# Patient Record
Sex: Male | Born: 2000
Health system: Southern US, Community
[De-identification: ages and names within clinical notes are randomized; demographics above are authoritative.]

## PROBLEM LIST (undated history)

## (undated) DIAGNOSIS — Z789 Other specified health status: Secondary | ICD-10-CM

---
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
## 2020-07-31 ENCOUNTER — Emergency Department (HOSPITAL_BASED_OUTPATIENT_CLINIC_OR_DEPARTMENT_OTHER)
Admission: EM | Admit: 2020-07-31 | Discharge: 2020-07-31 | Disposition: A | Discharge status: Home / Self Care | Payer: BC Managed Care – PPO | Attending: Emergency Medicine | Admitting: Emergency Medicine

## 2020-07-31 ENCOUNTER — Other Ambulatory Visit: Payer: Self-pay

## 2020-07-31 ENCOUNTER — Emergency Department (HOSPITAL_BASED_OUTPATIENT_CLINIC_OR_DEPARTMENT_OTHER): Payer: BC Managed Care – PPO

## 2020-07-31 ENCOUNTER — Encounter (HOSPITAL_BASED_OUTPATIENT_CLINIC_OR_DEPARTMENT_OTHER): Payer: Self-pay | Admitting: Emergency Medicine

## 2020-07-31 DIAGNOSIS — Z7722 Contact with and (suspected) exposure to environmental tobacco smoke (acute) (chronic): Secondary | ICD-10-CM | POA: Insufficient documentation

## 2020-07-31 DIAGNOSIS — S99912A Unspecified injury of left ankle, initial encounter: Secondary | ICD-10-CM | POA: Diagnosis present

## 2020-07-31 DIAGNOSIS — Y9367 Activity, basketball: Secondary | ICD-10-CM | POA: Diagnosis not present

## 2020-07-31 DIAGNOSIS — S82842A Displaced bimalleolar fracture of left lower leg, initial encounter for closed fracture: Secondary | ICD-10-CM

## 2020-07-31 DIAGNOSIS — W2105XA Struck by basketball, initial encounter: Secondary | ICD-10-CM | POA: Diagnosis not present

## 2020-07-31 MED ORDER — OXYCODONE-ACETAMINOPHEN 5-325 MG PO TABS: 1.0000 | ORAL_TABLET | ORAL | 0 refills | Status: DC | PRN

## 2020-07-31 MED ORDER — OXYCODONE-ACETAMINOPHEN 5-325 MG PO TABS
2.0000 | ORAL_TABLET | Freq: Once | ORAL | Status: AC
  Administered 2020-07-31: 2 via ORAL
  Filled 2020-07-31: qty 2

## 2020-07-31 MED ORDER — IBUPROFEN 800 MG PO TABS: 800.0000 mg | ORAL_TABLET | Freq: Three times a day (TID) | ORAL | 0 refills | Status: DC

## 2020-07-31 NOTE — ED Notes (Addendum)
Splint applied to left leg/ankle by Trinna Post, nursing aid. Pt given/educated on crutches.

## 2020-07-31 NOTE — Discharge Instructions (Signed)
You have an ankle fracture.  Please keep the splint in place and use crutches  Take Tylenol and Motrin for pain  Take Percocet for severe pain.  Apply ice for swelling  Please call your orthopedic doctor tomorrow to get an appointment this week  Return to ER if you have worse ankle pain and foot swelling and toes turning

## 2020-07-31 NOTE — ED Provider Notes (Signed)
[facility] EMERGENCY DEPARTMENT Provider Note   CSN: [id number] Arrival date & time: 07/31/20  1946     History Chief Complaint  Patient presents with  . Ankle Pain    Bruce Hickman is a 20 y.o. male here presenting with left ankle pain and swelling.  Patient states that he was playing basketball this afternoon and jumped up and landed on his left foot and heard a pop.  He states that he was able to bear some weight on it but has progressive pain and swelling.  He states that he use his father's crutches and came here for further evaluation.  Denies any previous injury to that ankle.  Denies any previous medical problems  The history is provided by the patient.       History reviewed. No pertinent past medical history.  Patient Active Problem List   Diagnosis Date Noted  . Poor sleep hygiene 05/23/2017  . Well child check 12/01/2013    History reviewed. No pertinent surgical history.     Family History  Problem Relation Age of Onset  . High blood pressure Father   . Other Father        R hand surgery  . Sleep apnea Father   . Diabetes Paternal Grandmother   . High blood pressure Paternal Grandmother   . Other Paternal Grandfather        prediabetes    Social History   Tobacco Use  . Smoking status: Passive Smoke Exposure - Never Smoker  . Smokeless tobacco: Never Used  Substance Use Topics  . Alcohol use: Never  . Drug use: Yes    Types: Marijuana    Home Medications Prior to Admission medications   Medication Sig Start Date End Date Taking? Authorizing Provider  Multiple Vitamins-Minerals (MULTIVITAMIN ADULTS PO) Take 1 tablet by mouth daily.    [provider]    Allergies    Patient has no known allergies.  Review of Systems   Review of Systems  Musculoskeletal:       L ankle pain and swelling   All other systems reviewed and are negative.   Physical Exam Updated Vital Signs BP (!) 147/51 (BP Location: Right Arm)    Pulse 92   Temp 99.2 F (37.3 C) (Oral)   Resp 17   Ht 6\' 6"  (1.981 m)   Wt 125.2 kg   SpO2 100%   BMI 31.90 kg/m   Physical Exam Vitals and nursing note reviewed.  Constitutional:      Appearance: Normal appearance.     Comments: Uncomfortable,  HENT:     Head: Normocephalic and atraumatic.     Nose: Nose normal.     Mouth/Throat:     Mouth: Mucous membranes are moist.  Eyes:     Extraocular Movements: Extraocular movements intact.     Pupils: Pupils are equal, round, and reactive to light.  Cardiovascular:     Rate and Rhythm: Normal rate and regular rhythm.     Pulses: Normal pulses.     Heart sounds: Normal heart sounds.  Pulmonary:     Effort: Pulmonary effort is normal.     Breath sounds: Normal breath sounds.  Abdominal:     General: Abdomen is flat.     Palpations: Abdomen is soft.  Musculoskeletal:     Cervical back: Normal range of motion and neck supple.     Comments: Left ankle swollen.  Patient has good pedal pulses.  Patient is able to wiggle  toes and has no foot tenderness.  Skin:    General: Skin is warm.     Capillary Refill: Capillary refill takes less than 2 seconds.  Neurological:     General: No focal deficit present.     Mental Status: He is alert and oriented to person, place, and time.  Psychiatric:        Mood and Affect: Mood normal.        Behavior: Behavior normal.     ED Results / Procedures / Treatments   Labs (all labs ordered are listed, but only abnormal results are displayed) Labs Reviewed - No data to display  EKG None  Radiology DG Ankle Complete Left  Result Date: 07/31/2020 CLINICAL DATA:  Twisting injury EXAM: LEFT ANKLE COMPLETE - 3+ VIEW COMPARISON:  None. FINDINGS: Acute nondisplaced fracture involving the medial malleolus. Acute avulsion fracture at the tip of the fibular malleolus. Marked lateral soft tissue swelling. Posterior malleolus without definitive fracture lucency. Ankle mortise is symmetric. IMPRESSION:  Acute bimalleolar fracture with considerable soft tissue swelling Electronically Signed   By: Jasmine Pang M.D.   On: 07/31/2020 20:40    Procedures Procedures   Medications Ordered in ED Medications  oxyCODONE-acetaminophen (PERCOCET/ROXICET) 5-325 MG per tablet 2 tablet (2 tablets Oral Given 07/31/20 2140)    ED Course  I have reviewed the triage vital signs and the nursing notes.  Pertinent labs & imaging results that were available during my care of the patient were reviewed by me and considered in my medical decision making (see chart for details).    MDM Rules/Calculators/A&P                         Bruce Hickman is a 20 y.o. male here with left ankle pain and swelling.  Patient landed on his left ankle.  His ankle is obviously swollen and x-ray showed acute bimalleolar fracture.  There is no obvious dislocation.  Patient has good dorsalis pedis pulse able to wiggle his toes and he is neurovascular intact in the foot.  The tech was able to place posterior and sugar tong splint.  Also patient was given crutches.  I told him that he will likely need surgery for this.  Will discharge patient home with some Motrin and Percocet and have him follow-up with orthopedic doctor this week.   Final Clinical Impression(s) / ED Diagnoses Final diagnoses:  None    Rx / DC Orders ED Discharge Orders    None       Charlynne Pander, MD 07/31/20 2202

## 2020-07-31 NOTE — ED Triage Notes (Signed)
Reports coming down on the left foot wrong while playing basketball today.  Heard a pop.  Now having pain and swelling.

## 2022-03-09 ENCOUNTER — Emergency Department (HOSPITAL_COMMUNITY): Payer: BC Managed Care – PPO

## 2022-03-09 ENCOUNTER — Inpatient Hospital Stay
Admission: RE | Admit: 2022-03-09 | Discharge: 2022-03-09 | Disposition: A | Discharge status: Home / Self Care | Payer: Self-pay | Source: Ambulatory Visit

## 2022-03-09 ENCOUNTER — Emergency Department (HOSPITAL_COMMUNITY)
Admission: EM | Admit: 2022-03-09 | Discharge: 2022-03-09 | Disposition: A | Discharge status: Home / Self Care | Payer: BC Managed Care – PPO | Attending: Emergency Medicine | Admitting: Emergency Medicine

## 2022-03-09 ENCOUNTER — Ambulatory Visit
Admission: EM | Admit: 2022-03-09 | Discharge: 2022-03-09 | Disposition: A | Discharge status: Other Acute Inpatient Hospital | Payer: BC Managed Care – PPO

## 2022-03-09 ENCOUNTER — Encounter: Payer: Self-pay | Admitting: Emergency Medicine

## 2022-03-09 ENCOUNTER — Other Ambulatory Visit: Payer: Self-pay

## 2022-03-09 ENCOUNTER — Encounter (HOSPITAL_COMMUNITY): Payer: Self-pay

## 2022-03-09 DIAGNOSIS — J029 Acute pharyngitis, unspecified: Secondary | ICD-10-CM

## 2022-03-09 DIAGNOSIS — R739 Hyperglycemia, unspecified: Secondary | ICD-10-CM | POA: Diagnosis not present

## 2022-03-09 DIAGNOSIS — J36 Peritonsillar abscess: Secondary | ICD-10-CM

## 2022-03-09 LAB — BASIC METABOLIC PANEL
Anion gap: 7 (ref 5–15)
BUN: 8 mg/dL (ref 6–20)
CO2: 29 mmol/L (ref 22–32)
Calcium: 9.5 mg/dL (ref 8.9–10.3)
Chloride: 102 mmol/L (ref 98–111)
Creatinine, Ser: 0.93 mg/dL (ref 0.61–1.24)
GFR, Estimated: >60 mL/min (ref >60)
Glucose, Bld: 105 mg/dL — ABNORMAL HIGH (ref 70–99)
Potassium: 4 mmol/L (ref 3.5–5.1)
Sodium: 138 mmol/L (ref 135–145)

## 2022-03-09 LAB — CBC WITH DIFFERENTIAL/PLATELET
Abs Immature Granulocytes: 0.03 10*3/uL (ref 0.00–0.07)
Basophils Absolute: 0 10*3/uL (ref 0.0–0.1)
Basophils Relative: 0 %
Eosinophils Absolute: 0.1 10*3/uL (ref 0.0–0.5)
Eosinophils Relative: 1 %
HCT: 47.7 % (ref 39.0–52.0)
Hemoglobin: 15.7 g/dL (ref 13.0–17.0)
Immature Granulocytes: 0 %
Lymphocytes Relative: 14 %
Lymphs Abs: 1.4 10*3/uL (ref 0.7–4.0)
MCH: 28.1 pg (ref 26.0–34.0)
MCHC: 32.9 g/dL (ref 30.0–36.0)
MCV: 85.5 fL (ref 80.0–100.0)
Monocytes Absolute: 1 10*3/uL (ref 0.1–1.0)
Monocytes Relative: 10 %
Neutro Abs: 7.3 10*3/uL (ref 1.7–7.7)
Neutrophils Relative %: 75 %
Platelets: 254 10*3/uL (ref 150–400)
RBC: 5.58 MIL/uL (ref 4.22–5.81)
RDW: 14 % (ref 11.5–15.5)
WBC: 9.8 10*3/uL (ref 4.0–10.5)
nRBC: 0 % (ref 0.0–0.2)

## 2022-03-09 LAB — GROUP A STREP BY PCR: Group A Strep by PCR: NOT DETECTED

## 2022-03-09 MED ORDER — CLINDAMYCIN HCL 150 MG PO CAPS: 450.0000 mg | ORAL_CAPSULE | Freq: Three times a day (TID) | ORAL | 0 refills | Status: DC

## 2022-03-09 MED ORDER — KETOROLAC TROMETHAMINE 15 MG/ML IJ SOLN
15.0000 mg | Freq: Once | INTRAMUSCULAR | Status: AC
  Administered 2022-03-09: 15 mg via INTRAVENOUS
  Filled 2022-03-09: qty 1

## 2022-03-09 MED ORDER — DEXAMETHASONE SODIUM PHOSPHATE 10 MG/ML IJ SOLN
10.0000 mg | Freq: Once | INTRAMUSCULAR | Status: AC
  Administered 2022-03-09: 10 mg via INTRAVENOUS
  Filled 2022-03-09: qty 1

## 2022-03-09 MED ORDER — IOHEXOL 300 MG/ML  SOLN
100.0000 mL | Freq: Once | INTRAMUSCULAR | Status: AC | PRN
  Administered 2022-03-09: 100 mL via INTRAVENOUS

## 2022-03-09 MED ORDER — LACTATED RINGERS IV BOLUS
1000.0000 mL | Freq: Once | INTRAVENOUS | Status: AC
  Administered 2022-03-09: 1000 mL via INTRAVENOUS

## 2022-03-09 NOTE — ED Notes (Signed)
Patient is being discharged from the Urgent Care and sent to the Emergency Department via pov . Per Ellsworth Lennox pa, patient is in need of higher level of care due to concern for peritonsillar abscess. Patient is aware and verbalizes understanding of plan of care.  Vitals:   03/09/22 1249  BP: 137/88  Pulse: 79  Resp: 18  Temp: 98.6 F (37 C)  SpO2: 97%

## 2022-03-09 NOTE — ED Notes (Signed)
Pt offered a cup of water at this time.

## 2022-03-09 NOTE — ED Triage Notes (Signed)
Patient sent over from urgent care after having a right peritonsillar abscess. Said it hurts to swallow.

## 2022-03-09 NOTE — ED Provider Notes (Signed)
[facility]    CSN: [id number] Arrival date & time: 03/09/22  1231      History   Chief Complaint Chief Complaint  Patient presents with   Sore Throat    HPI Bruce Hickman is a 21 y.o. male.   21 year old male presents with sore throat painful swallowing.  Indicates that 2 weeks ago he had strep throat and that he was treated with penicillin.  He relates that his symptoms resolved.  He indicates over the past 2 days he has had a reoccurrence of sore throat, painful swallowing, mainly located on the right side.  Patient denies any fever, chills, nausea or vomiting.  He relates he is not having any upper respiratory symptoms associated with the sore throat.   Sore Throat    History reviewed. No pertinent past medical history.  Patient Active Problem List   Diagnosis Date Noted   Poor sleep hygiene 05/23/2017   Well child check 12/01/2013    History reviewed. No pertinent surgical history.     Home Medications    Prior to Admission medications   Medication Sig Start Date End Date Taking? Authorizing Provider  ibuprofen (ADVIL) 800 MG tablet Take 1 tablet (800 mg total) by mouth 3 (three) times daily. 07/31/20   Charlynne Pander, MD  Multiple Vitamins-Minerals (MULTIVITAMIN ADULTS PO) Take 1 tablet by mouth daily.    [provider]  oxyCODONE-acetaminophen (PERCOCET) 5-325 MG tablet Take 1 tablet by mouth every 4 (four) hours as needed. 07/31/20   Charlynne Pander, MD    Family History Family History  Problem Relation Age of Onset   High blood pressure Father    Other Father        R hand surgery   Sleep apnea Father    Diabetes Paternal Grandmother    High blood pressure Paternal Grandmother    Other Paternal Grandfather        prediabetes    Social History Social History   Tobacco Use   Smoking status: Passive Smoke Exposure - Never Smoker   Smokeless tobacco: Never  Substance Use Topics   Alcohol use: Never   Drug use: Yes     Types: Marijuana     Allergies   Patient has no known allergies.   Review of Systems Review of Systems  HENT:  Positive for sore throat.      Physical Exam Triage Vital Signs ED Triage Vitals  Enc Vitals Group     BP 03/09/22 1249 137/88     Pulse Rate 03/09/22 1249 79     Resp 03/09/22 1249 18     Temp 03/09/22 1249 98.6 F (37 C)     Temp src --      SpO2 03/09/22 1249 97 %     Weight --      Height --      Head Circumference --      Peak Flow --      Pain Score 03/09/22 1250 8     Pain Loc --      Pain Edu? --      Excl. in GC? --    No data found.  Updated Vital Signs BP 137/88   Pulse 79   Temp 98.6 F (37 C)   Resp 18   SpO2 97%   Visual Acuity Right Eye Distance:   Left Eye Distance:   Bilateral Distance:    Right Eye Near:   Left Eye Near:    Bilateral  Near:     Physical Exam Constitutional:      Appearance: He is well-developed.  HENT:     Right Ear: Tympanic membrane and ear canal normal.     Left Ear: Tympanic membrane and ear canal normal.     Mouth/Throat:     Mouth: Mucous membranes are moist.     Pharynx: Pharyngeal swelling and posterior oropharyngeal erythema present.     Tonsils: Tonsillar abscess (right side peritonsillar area - moderate) present.  Cardiovascular:     Rate and Rhythm: Normal rate and regular rhythm.     Heart sounds: Normal heart sounds.  Pulmonary:     Effort: Pulmonary effort is normal.     Breath sounds: Normal breath sounds and air entry. No wheezing, rhonchi or rales.  Lymphadenopathy:     Cervical: Cervical adenopathy (right anterior moderate) present.  Neurological:     Mental Status: He is alert.      UC Treatments / Results  Labs (all labs ordered are listed, but only abnormal results are displayed) Labs Reviewed - No data to display  EKG   Radiology No results found.  Procedures Procedures (including critical care time)  Medications Ordered in UC Medications - No data to  display  Initial Impression / Assessment and Plan / UC Course  I have reviewed the triage vital signs and the nursing notes.  Pertinent labs & imaging results that were available during my care of the patient were reviewed by me and considered in my medical decision making (see chart for details).    Plan: 1.  The acute peritonsillar abscess will be treated with the following: A.  Patient advised to report to the emergency room at [facility] for a higher level of care to evaluate the peritonsillar abscess. 2.  Advised follow-up PCP or return to urgent care as needed. Final Clinical Impressions(s) / UC Diagnoses   Final diagnoses:  Acute pharyngitis, unspecified etiology  Peritonsillar abscess     Discharge Instructions      Advised to report to the emergency room for evaluation of an acute peritonsillar abscess on the right side.    ED Prescriptions   None    PDMP not reviewed this encounter.   Ellsworth Lennox, PA-C 03/09/22 1303

## 2022-03-09 NOTE — Discharge Instructions (Addendum)
You were seen in the emergency apartment today for sore throat.  You do have a likely early abscess in the back of your throat but is not drainable.  I am placing you on antibiotics that you will take 3 times a day over the next 10 days.  I have also referred you to ENT.  Please call them either today or tomorrow to schedule a follow-up appointment for early next week.  If you have any worsening such as inability to swallow liquids, open your mouth, difficulty breathing please present to [facility] emergency department.

## 2022-03-09 NOTE — Discharge Instructions (Signed)
Advised to report to the emergency room for evaluation of an acute peritonsillar abscess on the right side.

## 2022-03-09 NOTE — ED Provider Triage Note (Signed)
Emergency Medicine Provider Triage Evaluation Note  Bruce Hickman , a 21 y.o. male  was evaluated in triage.  Pt complains of sore throat for 2 days.  States that he has had sore throat and difficulty swallowing over the past few days.  Has been tolerating liquids.  He denies having any fevers.  Denies having any wheezing.  States that he was seen at urgent care and sent here..  Review of Systems  Positive: See above Negative:   Physical Exam  BP 133/88 (BP Location: Left Arm)   Pulse 70   Temp 98.7 F (37.1 C) (Oral)   Resp 16   SpO2 100%  Gen:   Awake, no distress   Resp:  Normal effort  MSK:   Moves extremities without difficulty  Other:  No stridor or wheezing.  He has obvious PTA in the posterior right oropharynx  Medical Decision Making  Medically screening exam initiated at 1:31 PM.  Appropriate orders placed.  Bruce Hickman was informed that the remainder of the evaluation will be completed by another provider, this initial triage assessment does not replace that evaluation, and the importance of remaining in the ED until their evaluation is complete.   We will give Decadron IV and order basic labs.  Consult ENT prior to any imaging   Bruce Peru, PA-C 03/09/22 1332

## 2022-03-09 NOTE — ED Provider Notes (Signed)
Care of patient handed off to me at this time by Reita Chard. Briefly, 21 year old male who presents for sore throat and change in voice. Found to have unilateral tonsillar swelling concerning to PTA. Tolerates PO.  Physical Exam  BP 133/88 (BP Location: Left Arm)   Pulse 70   Temp 98.7 F (37.1 C) (Oral)   Resp 16   SpO2 100%   Physical Exam Vitals and nursing note reviewed.  Constitutional:      General: He is not in acute distress.    Appearance: Normal appearance. He is not ill-appearing or toxic-appearing.  HENT:     Head: Normocephalic and atraumatic.     Nose: Nose normal.     Mouth/Throat:     Mouth: Mucous membranes are moist.     Pharynx: Posterior oropharyngeal erythema present.     Tonsils: Tonsillar abscess present. No tonsillar exudate. 3+ on the right. 0 on the left.     Comments: Right-sided tonsillar swelling without exudate. The uvula is slightly deviated to the left. Hesitant to open mouth, but with encouragement do not appreciate true trismus.  No stridor or wheezing. No drooling. No sublingual swelling. Eyes:     Extraocular Movements: Extraocular movements intact.     Pupils: Pupils are equal, round, and reactive to light.  Cardiovascular:     Rate and Rhythm: Normal rate and regular rhythm.     Pulses: Normal pulses.     Heart sounds: No murmur heard. Pulmonary:     Effort: Pulmonary effort is normal. No respiratory distress.     Breath sounds: Normal breath sounds. No stridor. No wheezing, rhonchi or rales.  Musculoskeletal:     Cervical back: Neck supple.  Lymphadenopathy:     Cervical: Cervical adenopathy present.  Skin:    General: Skin is warm and dry.     Capillary Refill: Capillary refill takes less than 2 seconds.  Neurological:     General: No focal deficit present.     Mental Status: He is alert and oriented to person, place, and time.     Procedures  Procedures  ED Course / MDM    Medical Decision Making Amount and/or Complexity  of Data Reviewed Labs: ordered. Radiology: ordered.  Risk Prescription drug management.   Previous PA called ENT who requested imaging before any intervention.  Labs are normal  Given decadron, toradol. CT shows a fluid collection concerning for phlegmon or early abscess. No drainable fluid collection.  Tolerated PO here in ED. Will prescribe clindamycin and strict return precautions for worsening symptoms. To follow-up with ENT today or early next week.        Cristopher Peru, PA-C 03/09/22 1655    Bethann Berkshire, MD 03/09/22 [phone]

## 2022-03-09 NOTE — ED Provider Notes (Signed)
[facility]-EMERGENCY DEPT Provider Note   CSN: [id number] Arrival date & time: 03/09/22  1322     History Chief Complaint  Patient presents with   Throat Pain    Bruce Hickman is a 21 y.o. male otherwise healthy presents to the ER for evaluation of sore throat. The patient reports that he was treated for strep throat two weeks ago with PCN and his symptoms resolved. He reports that the pain came back yesterday and has been worsening with a sore throat, mainly on the right. He has tried throat spray, but not ibuprofen/tylenol. Denies any fevers or chills. He has had pain with eating and drinking. Denies any SOB or trouble breathing. He was seen at [facility] today and sent over for further evaluation.   HPI     Home Medications Prior to Admission medications   Medication Sig Start Date End Date Taking? Authorizing Provider  ibuprofen (ADVIL) 800 MG tablet Take 1 tablet (800 mg total) by mouth 3 (three) times daily. 07/31/20   Charlynne Pander, MD  Multiple Vitamins-Minerals (MULTIVITAMIN ADULTS PO) Take 1 tablet by mouth daily.    [provider]  oxyCODONE-acetaminophen (PERCOCET) 5-325 MG tablet Take 1 tablet by mouth every 4 (four) hours as needed. 07/31/20   Charlynne Pander, MD      Allergies    Patient has no known allergies.    Review of Systems   Review of Systems  Constitutional:  Negative for chills and fever.  HENT:  Positive for sore throat.   Respiratory:  Negative for shortness of breath.   Gastrointestinal:  Negative for abdominal pain, nausea and vomiting.    Physical Exam Updated Vital Signs BP 133/88 (BP Location: Left Arm)   Pulse 70   Temp 98.7 F (37.1 C) (Oral)   Resp 16   SpO2 100%  Physical Exam Vitals and nursing note reviewed.  Constitutional:      General: He is not in acute distress.    Appearance: He is not ill-appearing or toxic-appearing.  HENT:     Mouth/Throat:     Mouth: Mucous membranes are moist.      Comments: Mucous membranes.  Uvula is midline.  He does have significantly bilateral enlarged tonsils, right greater than left.  No exudate noted.  There is some slight erythema.  Patient is controlling secretions.  He does have the classic "hot potato voice". Cardiovascular:     Rate and Rhythm: Normal rate.  Pulmonary:     Effort: Pulmonary effort is normal. No respiratory distress.     Breath sounds: Normal breath sounds. No stridor.  Musculoskeletal:     Cervical back: Normal range of motion.  Lymphadenopathy:     Cervical: Cervical adenopathy present.  Skin:    General: Skin is warm and dry.  Neurological:     General: No focal deficit present.     Mental Status: He is alert. Mental status is at baseline.  Psychiatric:        Mood and Affect: Mood normal.     ED Results / Procedures / Treatments   Labs (all labs ordered are listed, but only abnormal results are displayed) Labs Reviewed  GROUP A STREP BY PCR  BASIC METABOLIC PANEL  CBC WITH DIFFERENTIAL/PLATELET    EKG None  Radiology No results found.  Procedures Procedures   Medications Ordered in ED Medications  ketorolac (TORADOL) 15 MG/ML injection 15 mg (has no administration in time range)  lactated ringers bolus 1,000 mL (  has no administration in time range)  dexamethasone (DECADRON) injection 10 mg (10 mg Intravenous Given 03/09/22 1403)    ED Course/ Medical Decision Making/ A&P                            Medical Decision Making Amount and/or Complexity of Data Reviewed Radiology: ordered.  Risk Prescription drug management.   21 y/o M presents to the emergency department for evaluation of tonsillar swelling.  Differential diagnosis includes was limited to tonsillitis, pharyngitis, strep throat, PTA.  Vital signs are unremarkable.  Patient normotensive, afebrile, normal pulse rate, satting well room air without increased work of breathing.  Physical exam as noted above.  Lab and imaging work-up  started in triage.  Decadron, fluids, and toradol ordered for symptoms.   I independently reviewed and interpreted the patient's labs CBC without leukocytosis or anemia.  BMP shows slightly elevated glucose at 105 otherwise no electrolyte abnormality.  Group strep still in process.  Consulted GI so ENT and spoke with Dr. Ernestene Kiel who would like a CT with contrast before proceeding with bedside consult/procedure.  CT imaging of the neck is pending at this time.   3:10 PM Care of Bruce Hickman transferred to PA Mertha Baars at the end of my shift as the patient will require reassessment once labs/imaging have resulted. Patient presentation, ED course, and plan of care discussed with review of all pertinent labs and imaging. Please see his/her note for further details regarding further ED course and disposition. Plan at time of handoff is follow up on CT imaging +/- re-consulting ENT. This may be altered or completely changed at the discretion of the oncoming team pending results of further workup.  I discussed this case with my attending physician who cosigned this note including patient's presenting symptoms, physical exam, and planned diagnostics and interventions. Attending physician stated agreement with plan or made changes to plan which were implemented.   Attending physician assessed patient at bedside.  Final Clinical Impression(s) / ED Diagnoses Final diagnoses:  None    Rx / DC Orders ED Discharge Orders     None         Achille Rich, PA-C 03/09/22 1512    Wynetta Fines, MD 03/18/22 1843

## 2022-03-09 NOTE — ED Triage Notes (Signed)
Pt is present today with c/o sore throat x1 day

## 2022-03-11 ENCOUNTER — Other Ambulatory Visit: Payer: Self-pay

## 2022-03-11 ENCOUNTER — Emergency Department (HOSPITAL_COMMUNITY): Payer: BC Managed Care – PPO

## 2022-03-11 ENCOUNTER — Encounter (HOSPITAL_COMMUNITY): Payer: Self-pay | Admitting: Emergency Medicine

## 2022-03-11 ENCOUNTER — Emergency Department (HOSPITAL_COMMUNITY)
Admission: EM | Admit: 2022-03-11 | Discharge: 2022-03-11 | Disposition: A | Discharge status: Home / Self Care | Payer: BC Managed Care – PPO | Attending: Emergency Medicine | Admitting: Emergency Medicine

## 2022-03-11 DIAGNOSIS — J029 Acute pharyngitis, unspecified: Secondary | ICD-10-CM | POA: Diagnosis present

## 2022-03-11 DIAGNOSIS — J36 Peritonsillar abscess: Secondary | ICD-10-CM

## 2022-03-11 DIAGNOSIS — J039 Acute tonsillitis, unspecified: Secondary | ICD-10-CM | POA: Diagnosis not present

## 2022-03-11 LAB — I-STAT CHEM 8, ED
BUN: 13 mg/dL (ref 6–20)
Calcium, Ion: 1.12 mmol/L — ABNORMAL LOW (ref 1.15–1.40)
Chloride: 100 mmol/L (ref 98–111)
Creatinine, Ser: 0.9 mg/dL (ref 0.61–1.24)
Glucose, Bld: 98 mg/dL (ref 70–99)
HCT: 47 % (ref 39.0–52.0)
Hemoglobin: 16 g/dL (ref 13.0–17.0)
Potassium: 4.6 mmol/L (ref 3.5–5.1)
Sodium: 139 mmol/L (ref 135–145)
TCO2: 29 mmol/L (ref 22–32)

## 2022-03-11 MED ORDER — KETOROLAC TROMETHAMINE 30 MG/ML IJ SOLN
30.0000 mg | Freq: Once | INTRAMUSCULAR | Status: AC
  Administered 2022-03-11: 30 mg via INTRAVENOUS
  Filled 2022-03-11: qty 1

## 2022-03-11 MED ORDER — AMOXICILLIN-POT CLAVULANATE 875-125 MG PO TABS: 1.0000 | ORAL_TABLET | Freq: Two times a day (BID) | ORAL | 0 refills | Status: DC

## 2022-03-11 MED ORDER — IOHEXOL 300 MG/ML  SOLN
75.0000 mL | Freq: Once | INTRAMUSCULAR | Status: AC | PRN
  Administered 2022-03-11: 75 mL via INTRAVENOUS

## 2022-03-11 MED ORDER — NAPROXEN 375 MG PO TABS: 375.0000 mg | ORAL_TABLET | Freq: Two times a day (BID) | ORAL | 0 refills | Status: DC

## 2022-03-11 MED ORDER — DEXAMETHASONE SODIUM PHOSPHATE 10 MG/ML IJ SOLN
10.0000 mg | Freq: Once | INTRAMUSCULAR | Status: AC
  Administered 2022-03-11: 10 mg via INTRAVENOUS
  Filled 2022-03-11: qty 1

## 2022-03-11 MED ORDER — LIDOCAINE-EPINEPHRINE 2 %-1:100000 IJ SOLN
20.0000 mL | Freq: Once | INTRAMUSCULAR | Status: AC
  Administered 2022-03-11: 2 mL via INTRADERMAL
  Filled 2022-03-11: qty 1

## 2022-03-11 MED ORDER — FENTANYL CITRATE PF 50 MCG/ML IJ SOSY
50.0000 ug | PREFILLED_SYRINGE | Freq: Once | INTRAMUSCULAR | Status: AC | PRN
  Administered 2022-03-11: 50 ug via INTRAVENOUS
  Filled 2022-03-11: qty 1

## 2022-03-11 NOTE — ED Provider Notes (Incomplete)
  [facility]    CSN: [id number] Arrival date & time:         History   Chief Complaint Chief Complaint  Patient presents with  . Sore Throat    Entered by patient    HPI Vail Vuncannon is a 21 y.o. male.    Sore Throat  No past medical history on file.  There are no problems to display for this patient.   *** The histories are not reviewed yet. Please review them in the "History" navigator section and refresh this SmartLink.     Home Medications    Prior to Admission medications   Not on File    Family History No family history on file.  Social History     Allergies   Patient has no allergy information on record.   Review of Systems Review of Systems   Physical Exam Triage Vital Signs ED Triage Vitals  Enc Vitals Group     BP      Pulse      Resp      Temp      Temp src      SpO2      Weight      Height      Head Circumference      Peak Flow      Pain Score      Pain Loc      Pain Edu?      Excl. in GC?    No data found.  Updated Vital Signs There were no vitals taken for this visit.  Visual Acuity Right Eye Distance:   Left Eye Distance:   Bilateral Distance:    Right Eye Near:   Left Eye Near:    Bilateral Near:     Physical Exam   UC Treatments / Results  Labs (all labs ordered are listed, but only abnormal results are displayed) Labs Reviewed - No data to display  EKG   Radiology No results found.  Procedures Procedures (including critical care time)  Medications Ordered in UC Medications - No data to display  Initial Impression / Assessment and Plan / UC Course  I have reviewed the triage vital signs and the nursing notes.  Pertinent labs & imaging results that were available during my care of the patient were reviewed by me and considered in my medical decision making (see chart for details).     *** Final Clinical Impressions(s) / UC Diagnoses   Final diagnoses:  None    Discharge Instructions   None    ED Prescriptions   None    PDMP not reviewed this encounter.

## 2022-03-11 NOTE — Discharge Instructions (Addendum)
Contact a health care provider if: You have more pain, swelling, redness, or pus in your throat. You have a headache. You have a lack of energy (lethargy) or feel generally sick. You have a fever or chills. You have trouble swallowing or eating. You have signs of dehydration, such as: Light-headedness or dizziness when standing. Urinating less than usual. A fast heart rate. Dry mouth. Get help right away if: You are unable to swallow. You have trouble breathing, or it is easier for you to breathe when you lean forward. You cough up blood or vomit blood after treatment. You have severe throat pain that does not get better with medicine.

## 2022-03-11 NOTE — ED Triage Notes (Signed)
Pt c/o continued sore throat. Pt was diagnosed on 11/17 with tonsillitis.

## 2022-03-11 NOTE — Consult Note (Signed)
ENT CONSULT:  Reason for Consult: RIGHT Peritonsillar abscess  Referring Physician:  Deno Etienne PA / Margarita Mail PA  HPI: Bruce Hickman is an 21 y.o. male who re-presented to the [facility] ED overnight with complaint of throat pain present for at least the past several days - worsening despite oral abx given on Friday. No difficulty breathing or fevers.   CT neck this morning demonstrates a 2.4cm PTA.   Patient has never had this before. Has never had tonsillitis.    History reviewed. No pertinent past medical history.  History reviewed. No pertinent surgical history.  Family History  Problem Relation Age of Onset   High blood pressure Father    Other Father        R hand surgery   Sleep apnea Father    Diabetes Paternal Grandmother    High blood pressure Paternal Grandmother    Other Paternal Grandfather        prediabetes    Social History:  reports that he is a non-smoker but has been exposed to tobacco smoke. He has never used smokeless tobacco. He reports current drug use. Drug: Marijuana. He reports that he does not drink alcohol.  Allergies: No Known Allergies  Medications: I have reviewed the patient's current medications.  Results for orders placed or performed during the hospital encounter of 03/11/22 (from the past 48 hour(s))  I-stat chem 8, ED     Status: Abnormal   Collection Time: 03/11/22  6:00 AM  Result Value Ref Range   Sodium 139 135 - 145 mmol/L   Potassium 4.6 3.5 - 5.1 mmol/L   Chloride 100 98 - 111 mmol/L   BUN 13 6 - 20 mg/dL   Creatinine, Ser 0.90 0.61 - 1.24 mg/dL   Glucose, Bld 98 70 - 99 mg/dL    Comment: Glucose reference range applies only to samples taken after fasting for at least 8 hours.   Calcium, Ion 1.12 (L) 1.15 - 1.40 mmol/L   TCO2 29 22 - 32 mmol/L   Hemoglobin 16.0 13.0 - 17.0 g/dL   HCT 47.0 39.0 - 52.0 %    CT Soft Tissue Neck W Contrast  Result Date: 03/11/2022 CLINICAL DATA:  Epiglottitis or tonsillitis  suspected.  Sore throat EXAM: CT NECK WITH CONTRAST TECHNIQUE: Multidetector CT imaging of the neck was performed using the standard protocol following the bolus administration of intravenous contrast. RADIATION DOSE REDUCTION: This exam was performed according to the departmental dose-optimization program which includes automated exposure control, adjustment of the mA and/or kV according to patient size and/or use of iterative reconstruction technique. CONTRAST:  37mL OMNIPAQUE IOHEXOL 300 MG/ML  SOLN COMPARISON:  Two days ago FINDINGS: Pharynx and larynx: Suboptimal mucosal enhancement but still positive for tonsillar thickening and 2.4 cm right peritonsillar collection. Accentuated submucosal edema in the right pharynx. No supraglottitis. Salivary glands: Normal Thyroid: Normal Lymph nodes: Thickening of upper cervical lymph nodes, expected and non cavitary. Vascular: Unremarkable Limited intracranial: Negative Visualized orbits: Negative Mastoids and visualized paranasal sinuses: Clear Skeleton: No acute or aggressive finding Upper chest: Clear apical lungs IMPRESSION: 2.4 cm right peritonsillar abscess which has matured from CT 2 days ago. Electronically Signed   By: Jorje Guild M.D.   On: 03/11/2022 07:57   CT Soft Tissue Neck W Contrast  Result Date: 03/09/2022 CLINICAL DATA:  Sore throat. Right greater than left tonsillar swelling. Patient was treated with antibiotics 2 weeks ago with recurrent symptoms yesterday. EXAM: CT NECK WITH CONTRAST TECHNIQUE:  Multidetector CT imaging of the neck was performed using the standard protocol following the bolus administration of intravenous contrast. RADIATION DOSE REDUCTION: This exam was performed according to the departmental dose-optimization program which includes automated exposure control, adjustment of the mA and/or kV according to patient size and/or use of iterative reconstruction technique. CONTRAST:  177mL OMNIPAQUE IOHEXOL 300 MG/ML  SOLN  COMPARISON:  None Available. FINDINGS: Pharynx and larynx: Mild prominence of adenoid tissue noted without a discrete lesion. Soft palate is somewhat edematous. Asymmetric enlargement of the palatine tonsils is worse on the right. An ill-defined area of hypoattenuation along the lateral margin of the right palatine tonsil measures roughly 2.0 x 1.5 x 2.5 cm. No drainable fluid collection is evident. Asymmetric in phlegm a tori changes are present within the parapharyngeal fat. Tongue base is unremarkable. Vallecula and epiglottis are within normal limits. Aryepiglottic folds and piriform sinuses are clear. Insert normal cord Salivary glands: The submandibular and parotid glands and ducts are within normal limits. Thyroid: Normal Lymph nodes: Enlarged level 2 nodes are present, right greater than left. No necrotic or hyperdense nodes are present. Vascular: No focal vascular lesions or atherosclerotic change are present. Limited intracranial: Within normal limits. Visualized orbits: The visualized globes and orbits are within normal limits. Mastoids and visualized paranasal sinuses: The paranasal sinuses and mastoid air cells are clear. Skeleton: Vertebral body heights and alignment are normal. Straightening of the normal cervical lordosis is noted. Upper chest: Lung apices are clear. Thoracic inlet is within normal limits. IMPRESSION: 1. Asymmetric enlargement of the palatine tonsil is worse on the right, compatible with acute tonsillitis. 2. An ill-defined area of hypoattenuation along the lateral margin of the right palatine tonsil measures roughly 2.0 x 1.5 x 2.5 cm. This likely represents a phlegmon or early abscess. No drainable fluid collection is evident. 3. Enlarged level 2 nodes, right greater than left, are likely reactive. 4. Mild prominence of adenoid tissue without a discrete lesion. Electronically Signed   By: San Morelle M.D.   On: 03/09/2022 15:51    [id number] other than stated per  HPI  Blood pressure 121/80, pulse 67, temperature 98.5 F (36.9 C), temperature source Oral, resp. rate 16, SpO2 99 %.  PHYSICAL EXAM:  CONSTITUTIONAL: well developed, nourished, no distress and alert and oriented x 3 EYES: PERRL, EOMI  HENT: Head : normocephalic and atraumatic Ears: Right ear:   canal normal, external ear normal and hearing normal Left ear:   canal normal, external ear normal and hearing normal Nose: nose normal and no purulence Mouth/Throat: Significant right soft palate fullness/edema with uvular deviation. Tonsils 3+ NECK: supple, trachea normal and no thyromegaly or cervical LAD NEURO: CN II-XII symmetric intact   Studies Reviewed:CT Neck with contrast   IMPRESSION: 2.4 cm right peritonsillar abscess which has matured from CT 2 days ago.     Electronically Signed   By: Jorje Guild M.D.   On: 03/11/2022 07:57   PROCEDURE - INCISION AND DRAINAGE OF RIGHT PERITONSILLAR ABSCESS  Findings: Peritonsillar abscess and purulence was encountered on the RIGHT side.  Details: Informed consent was obtained.  The patient's RIGHT peritonsillar region was anesthetized with 2cc of 2% lidocaine with 1:100,000 epinephrine injected into the  soft palate.  The peritonsillar region was incised with a 11 blade.  A curved hemostat was utilized to open up all loculations.  Copious frank purulence was encountered ~10 cc.  This was evacuated. Next, the peritonsillar space was irrigated copiously with normal saline and the  patient's mouth was suctioned.  Hemostasis was achieved spontaneously.  All instrumentation was removed.  The patient tolerated the procedure well with no complications appreciated.    Assessment/Plan: RIGHT PTA s/p Incision and Drainage  - Continue systemic abx x 10 days - Salt water gargles - ADAT - ENT Clinic referral to discuss outpatient tonsillectomy in 2-3 months    I have personally spent 35 minutes involved in face-to-face and non-face-to-face  activities for this patient on the day of the visit.  Professional time spent includes the following activities, in addition to those noted in the documentation: preparing to see the patient (eg, review of tests), obtaining and/or reviewing separately obtained history, performing a medically appropriate examination and/or evaluation, counseling and educating the patient/family/caregiver, ordering medications, tests or procedures, referring and communicating with other healthcare professionals, documenting clinical information in the electronic or other health record, independently interpreting results and communicating results with the patient/family/caregiver, care coordination.  Electronically signed by:  Scarlette Ar, MD  Staff Physician Facial Plastic & Reconstructive Surgery Otolaryngology - Head and Neck Surgery [facility]   03/11/2022, 9:06 AM

## 2022-03-11 NOTE — ED Provider Notes (Signed)
[facility]EM[facility]GENCY DEPT Provider Note   CSN: [id number] Arrival date & time: 03/11/22  0308     History  Chief Complaint  Patient presents with   Sore Throat    Alias Bruce Hickman is a 21 y.o. male.  HPI   Patient out significant medical history presents with complaints of throat pain, been going on for last couple days, states that he was seen in the ER 2 days ago, states his pain is worsening, states he feels like he has worsening swelling on the right side of his throat, states he has difficulty swallowing but is still able to tolerate p.o., no tongue throat lip swelling difficulty breathing, no fevers or chills, states he been compliant with his antibiotics.  Reviewed patient's chart was seen on 17th, CT scan was obtained, shows tonsillitis of the right throat, and development of phlegmon versus early abscess, started on antibiotics follow-up with ENT.    Home Medications Prior to Admission medications   Medication Sig Start Date End Date Taking? Authorizing Provider  clindamycin (CLEOCIN) 150 MG capsule Take 3 capsules (450 mg total) by mouth 3 (three) times daily for 10 days. 03/09/22 03/19/22  Cristopher Peru, PA-C  ibuprofen (ADVIL) 800 MG tablet Take 1 tablet (800 mg total) by mouth 3 (three) times daily. 07/31/20   Charlynne Pander, MD  Multiple Vitamins-Minerals (MULTIVITAMIN ADULTS PO) Take 1 tablet by mouth daily.    [provider]  oxyCODONE-acetaminophen (PERCOCET) 5-325 MG tablet Take 1 tablet by mouth every 4 (four) hours as needed. 07/31/20   Charlynne Pander, MD      Allergies    Patient has no known allergies.    Review of Systems   Review of Systems  Constitutional:  Negative for chills and fever.  HENT:  Positive for sore throat and trouble swallowing.   Respiratory:  Negative for shortness of breath.   Cardiovascular:  Negative for chest pain.  Gastrointestinal:  Negative for abdominal pain.  Neurological:  Negative for  headaches.    Physical Exam Updated Vital Signs BP 132/79   Pulse 73   Temp 98.4 F (36.9 C)   Resp 18   SpO2 100%  Physical Exam Vitals and nursing note reviewed.  Constitutional:      General: He is not in acute distress.    Appearance: He is not ill-appearing.  HENT:     Head: Normocephalic and atraumatic.     Nose: No congestion.     Mouth/Throat:     Mouth: Mucous membranes are moist.     Pharynx: Oropharynx is clear. Posterior oropharyngeal erythema present. No oropharyngeal exudate.     Comments: No facial swelling present, no trismus or torticollis, tongue uvula midline controlling oral secretions, patient has an enlarged right tonsil, with erythema present, minimal uvular deviation, no submandibular swelling muffled voice but is tolerating p.o. without difficulty. Eyes:     Extraocular Movements: Extraocular movements intact.     Pupils: Pupils are equal, round, and reactive to light.  Cardiovascular:     Rate and Rhythm: Normal rate and regular rhythm.     Pulses: Normal pulses.     Heart sounds: No murmur heard.    No friction rub. No gallop.  Pulmonary:     Effort: No respiratory distress.     Breath sounds: No wheezing, rhonchi or rales.  Skin:    General: Skin is warm and dry.  Neurological:     Mental Status: He is alert.  Psychiatric:  Mood and Affect: Mood normal.     ED Results / Procedures / Treatments   Labs (all labs ordered are listed, but only abnormal results are displayed) Labs Reviewed  I-STAT CHEM 8, ED - Abnormal; Notable for the following components:      Result Value   Calcium, Ion 1.12 (*)    All other components within normal limits  AEROBIC/ANAEROBIC CULTURE W GRAM STAIN (SURGICAL/DEEP WOUND)    EKG None  Radiology CT Soft Tissue Neck W Contrast  Result Date: 03/09/2022 CLINICAL DATA:  Sore throat. Right greater than left tonsillar swelling. Patient was treated with antibiotics 2 weeks ago with recurrent symptoms  yesterday. EXAM: CT NECK WITH CONTRAST TECHNIQUE: Multidetector CT imaging of the neck was performed using the standard protocol following the bolus administration of intravenous contrast. RADIATION DOSE REDUCTION: This exam was performed according to the departmental dose-optimization program which includes automated exposure control, adjustment of the mA and/or kV according to patient size and/or use of iterative reconstruction technique. CONTRAST:  OMNIPAQUE IOHEXOL 300 MG/ML  SOLN COMPARISON:  None Available. FINDINGS: Pharynx and larynx: Mild prominence of adenoid tissue noted without a discrete lesion. Soft palate is somewhat edematous. Asymmetric enlargement of the palatine tonsils is worse on the right. An ill-defined area of hypoattenuation along the lateral margin of the right palatine tonsil measures roughly 2.0 x 1.5 x 2.5 cm. No drainable fluid collection is evident. Asymmetric in phlegm a tori changes are present within the parapharyngeal fat. Tongue base is unremarkable. Vallecula and epiglottis are within normal limits. Aryepiglottic folds and piriform sinuses are clear. Insert normal cord Salivary glands: The submandibular and parotid glands and ducts are within normal limits. Thyroid: Normal Lymph nodes: Enlarged level 2 nodes are present, right greater than left. No necrotic or hyperdense nodes are present. Vascular: No focal vascular lesions or atherosclerotic change are present. Limited intracranial: Within normal limits. Visualized orbits: The visualized globes and orbits are within normal limits. Mastoids and visualized paranasal sinuses: The paranasal sinuses and mastoid air cells are clear. Skeleton: Vertebral body heights and alignment are normal. Straightening of the normal cervical lordosis is noted. Upper chest: Lung apices are clear. Thoracic inlet is within normal limits. IMPRESSION: 1. Asymmetric enlargement of the palatine tonsil is worse on the right, compatible with acute  tonsillitis. 2. An ill-defined area of hypoattenuation along the lateral margin of the right palatine tonsil measures roughly 2.0 x 1.5 x 2.5 cm. This likely represents a phlegmon or early abscess. No drainable fluid collection is evident. 3. Enlarged level 2 nodes, right greater than left, are likely reactive. 4. Mild prominence of adenoid tissue without a discrete lesion. Electronically Signed   By: Marin Roberts M.D.   On: 03/09/2022 15:51    Procedures Procedures    Medications Ordered in ED Medications  ketorolac (TORADOL) 30 MG/ML injection 30 mg (has no administration in time range)  dexamethasone (DECADRON) injection 10 mg (10 mg Intravenous Given 03/11/22 0603)    ED Course/ Medical Decision Making/ A&P Clinical Course as of 03/11/22 9798  [facility] Mar 11, 2022  0640 Patient here with persistent tonsil pain. Repeat scan. F/u with ENT.  [AH]    Clinical Course User Index [AH] Arthor Captain, PA-C                           Medical Decision Making Amount and/or Complexity of Data Reviewed Radiology: ordered.  Risk Prescription drug management.   This  patient presents to the ED for concern of sore throat, this involves an extensive number of treatment options, and is a complaint that carries with it a high risk of complications and morbidity.  The differential diagnosis includes retropharyngeal/peritonsillar abscess, lugwin    Additional history obtained:  Additional history obtained from N/A External records from outside source obtained and reviewed including ER notes   Co morbidities that complicate the patient evaluation  N/A  Social Determinants of Health:  N/A    Lab Tests:  I Ordered, and personally interpreted labs.  The pertinent results include: I-STAT unremarkable   Imaging Studies ordered:  I ordered imaging studies including CT neck I independently visualized and interpreted imaging which showed pending I agree with the radiologist  interpretation   Cardiac Monitoring:  The patient was maintained on a cardiac monitor.  I personally viewed and interpreted the cardiac monitored which showed an underlying rhythm of: N/A   Medicines ordered and prescription drug management:  I ordered medication including dexamethasone I have reviewed the patients home medicines and have made adjustments as needed  Critical Interventions:  N/A   Reevaluation:  Presents with a sore throat, exam seems consistent with prior presentation, but due to patient's statement of worsening symptoms will consult with ENT for further recommendations  Consultations Obtained:  I requested consultation with the ENT Dr. Ernestene Kiel,  and discussed lab and imaging findings as well as pertinent plan - they recommend: Repeat CT scan, provide patient with Decadron, obtain throat cultures, and call back once resulted for further recommendations.    Test Considered:  N/A    Rule out  Low suspicion for periorbital or orbital cellulitis as patient face had no erythematous, patient EOMs were intact, he had no pain with eye movement.  Suspicion for lugwin low at this time no submandibular swelling presentation is atypical     Dispostion and problem list  Due to shift change patient be handed off to Arthor Captain, PA-C  Follow-up on CT scan, consult ENT for ultimate disposition            Final Clinical Impression(s) / ED Diagnoses Final diagnoses:  Tonsillitis    Rx / DC Orders ED Discharge Orders     None         Barnie Del 03/11/22 2952    Tilden Fossa, MD 03/11/22 [phone]

## 2022-03-11 NOTE — ED Provider Notes (Signed)
  Physical Exam  BP 121/80 (BP Location: Left Arm)   Pulse 67   Temp 98.5 F (36.9 C) (Oral)   Resp 16   SpO2 99%   Physical Exam Vitals and nursing note reviewed.  Constitutional:      General: He is not in acute distress.    Appearance: He is well-developed. He is not diaphoretic.  HENT:     Head: Normocephalic and atraumatic.     Comments: + hot potato voice Enlarged R tonsil with leftward uvula deviation Tender tonsilar lymphnodes Eyes:     General: No scleral icterus.    Conjunctiva/sclera: Conjunctivae normal.  Cardiovascular:     Rate and Rhythm: Normal rate and regular rhythm.     Heart sounds: Normal heart sounds.  Pulmonary:     Effort: Pulmonary effort is normal. No respiratory distress.     Breath sounds: Normal breath sounds.  Abdominal:     Palpations: Abdomen is soft.     Tenderness: There is no abdominal tenderness.  Musculoskeletal:     Cervical back: Normal range of motion and neck supple.  Skin:    General: Skin is warm and dry.  Neurological:     Mental Status: He is alert.  Psychiatric:        Behavior: Behavior normal.     Procedures  Procedures  ED Course / MDM   Clinical Course as of 03/11/22 1719  Sun Mar 11, 2022  2956 Patient here with persistent tonsil pain. Repeat scan. F/u with ENT.  [AH]  2130 Case discussed with Dr. Ernestene Kiel who will come and do bedside I&D for PTA. Pt updated. [AH]    Clinical Course User Index [AH] Arthor Captain, PA-C   Medical Decision Making Amount and/or Complexity of Data Reviewed Radiology: ordered.  Risk Prescription drug management.   Patient with PTA  I visualized and interpreted results of ct neck \\I &D performed at bedsude by Dr. Ernestene Kiel. D/c with recs Feel better. Discussed f/u and return precautions       Arthor Captain, PA-C 03/11/22 1722    Cathren Laine, MD 03/13/22 2011

## 2022-03-14 LAB — AEROBIC CULTURE W GRAM STAIN (SUPERFICIAL SPECIMEN)
Culture: NORMAL
Gram Stain: NONE SEEN

## 2022-03-16 ENCOUNTER — Telehealth (HOSPITAL_BASED_OUTPATIENT_CLINIC_OR_DEPARTMENT_OTHER): Payer: Self-pay | Admitting: *Deleted

## 2022-03-16 NOTE — Telephone Encounter (Signed)
Post ED Visit - Positive Culture Follow-up  Culture report reviewed by antimicrobial stewardship pharmacist: [facility] []  , Pharm.D. []  Enzo Bi, Pharm.D., BCPS AQ-ID []  , Pharm.D., BCPS []  Celedonio Miyamoto, .D., BCPS []  [place], .D., BCPS, AAHIVP []  Georgina Pillion, Pharm.D., BCPS, AAHIVP []  [address], PharmD, BCPS []  , PharmD, BCPS []  [place], PharmD, BCPS []  [place], PharmD []  , PharmD, BCPS []  Estella Husk, PharmD  Pharmacy Team []  Lysle Pearl, PharmD []  , PharmD []  Phillips Climes, PharmD []  , Rph []  Agapito Games) , PharmD []  Verlan Friends, PharmD []  , PharmD []  Mervyn Gay, PharmD []  , PharmD []  Vinnie Level, PharmD []  [facility], PharmD []  , PharmD []  Len Childs, PharmD   Positive wound culture Treated with Amoxicillin, organism sensitive to the same and no further patient follow-up is required at this time.  , Greer Pickerel 03/16/2022, 5:53 AM

## 2022-07-23 IMAGING — CR DG ANKLE COMPLETE 3+V*L*
3 series · 3 of 3 positions shown · non-contrast
Comparison: None.

CLINICAL DATA: Twisting injury

EXAM:
LEFT ANKLE COMPLETE - 3+ VIEW

[t ankle joint ap left]
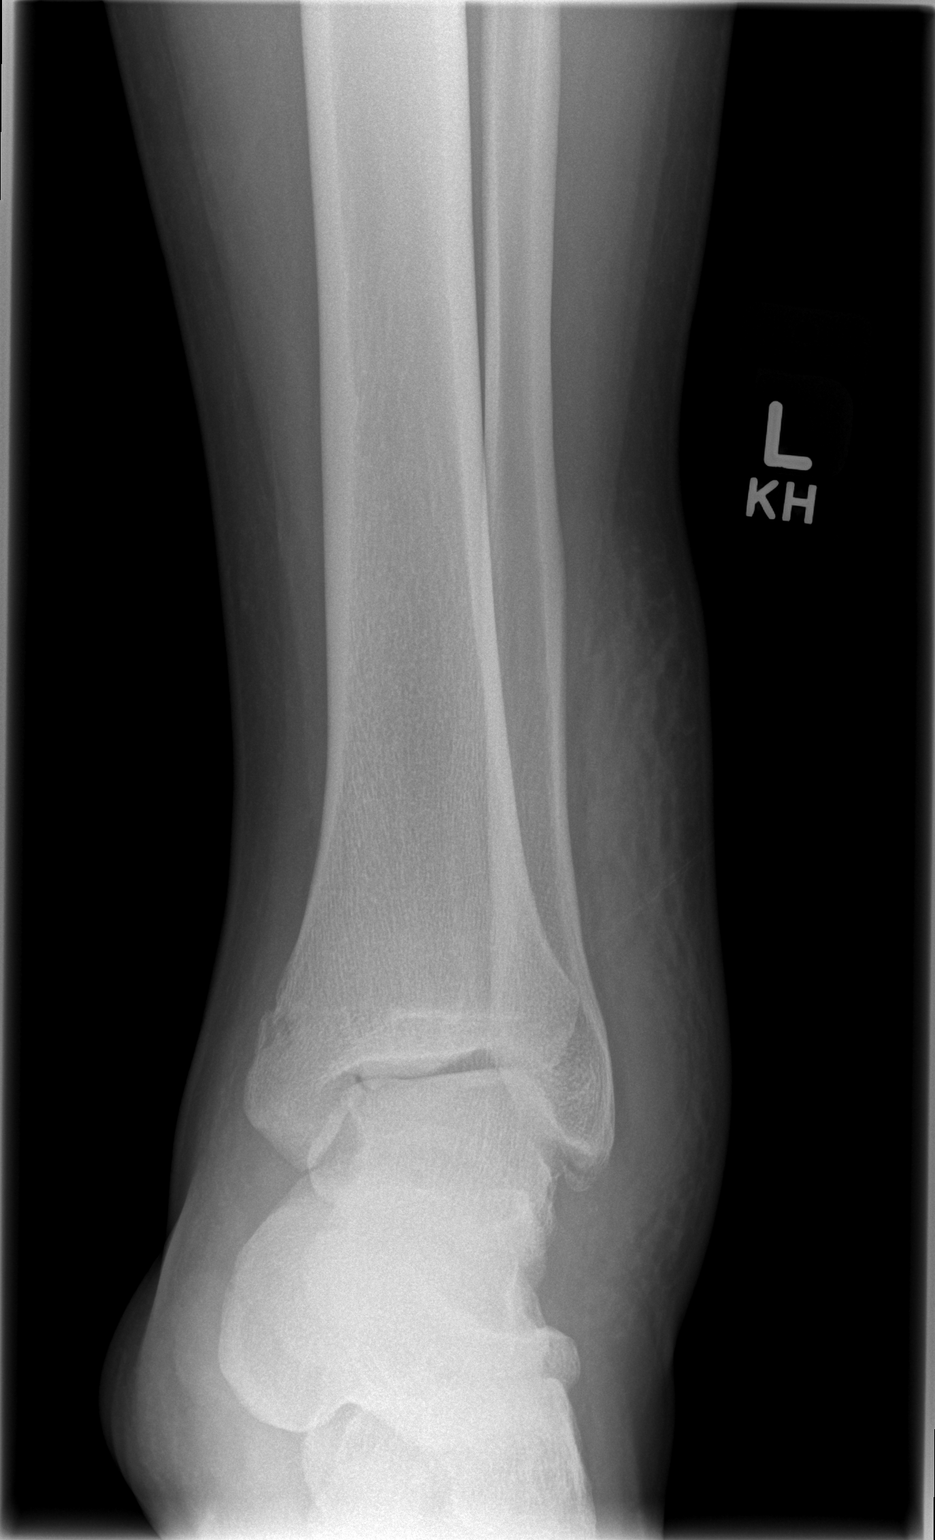

[t ankle joint oblique left]
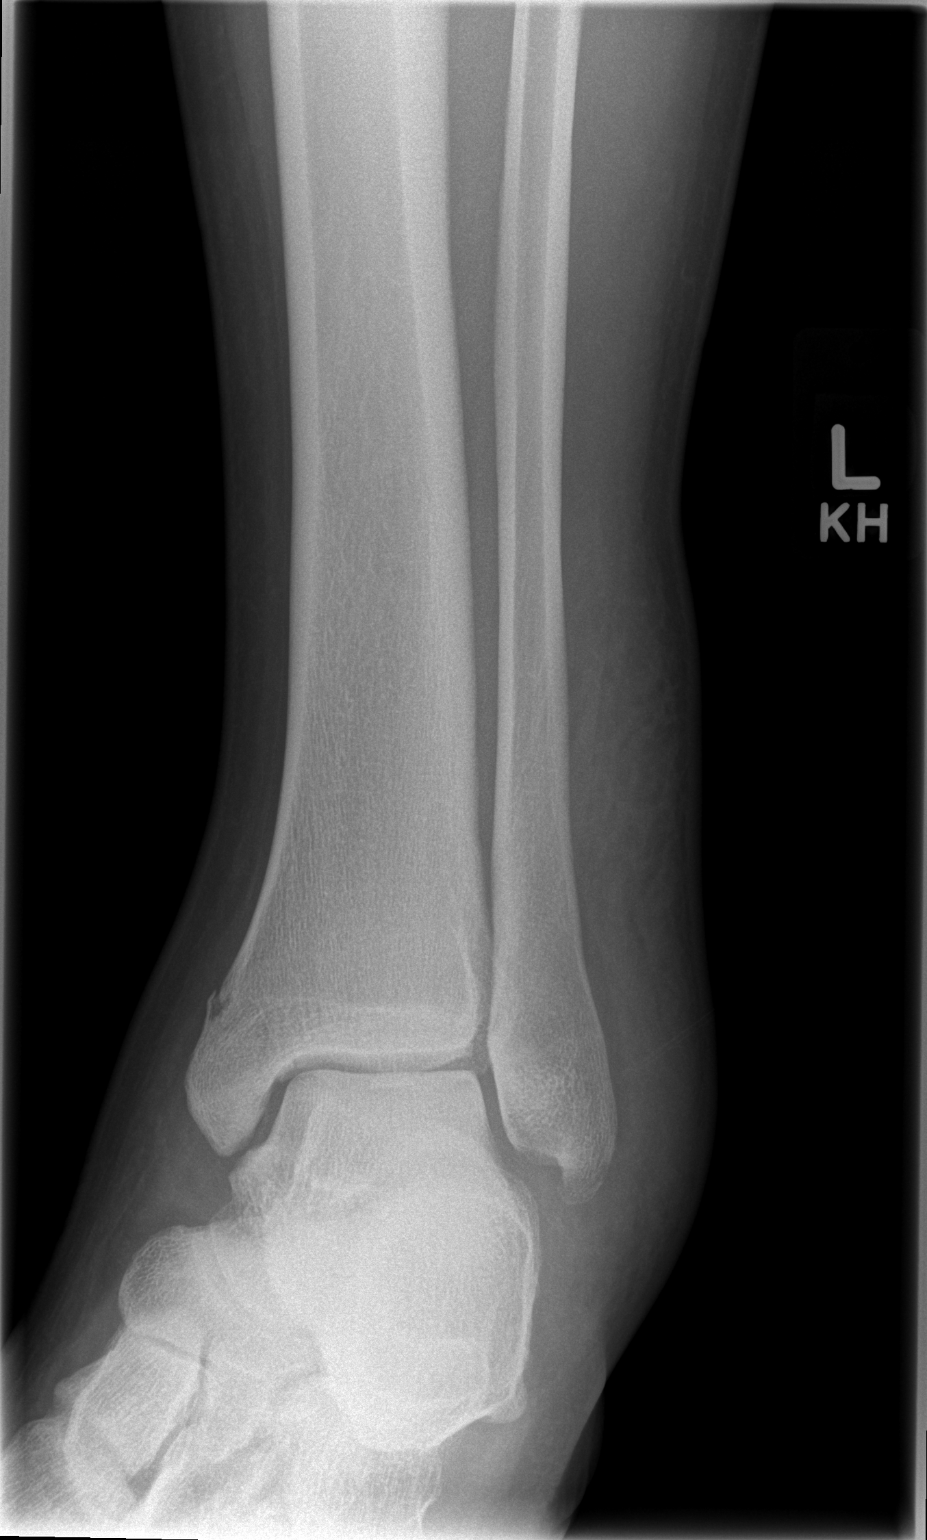

[t ankle joint lat left]
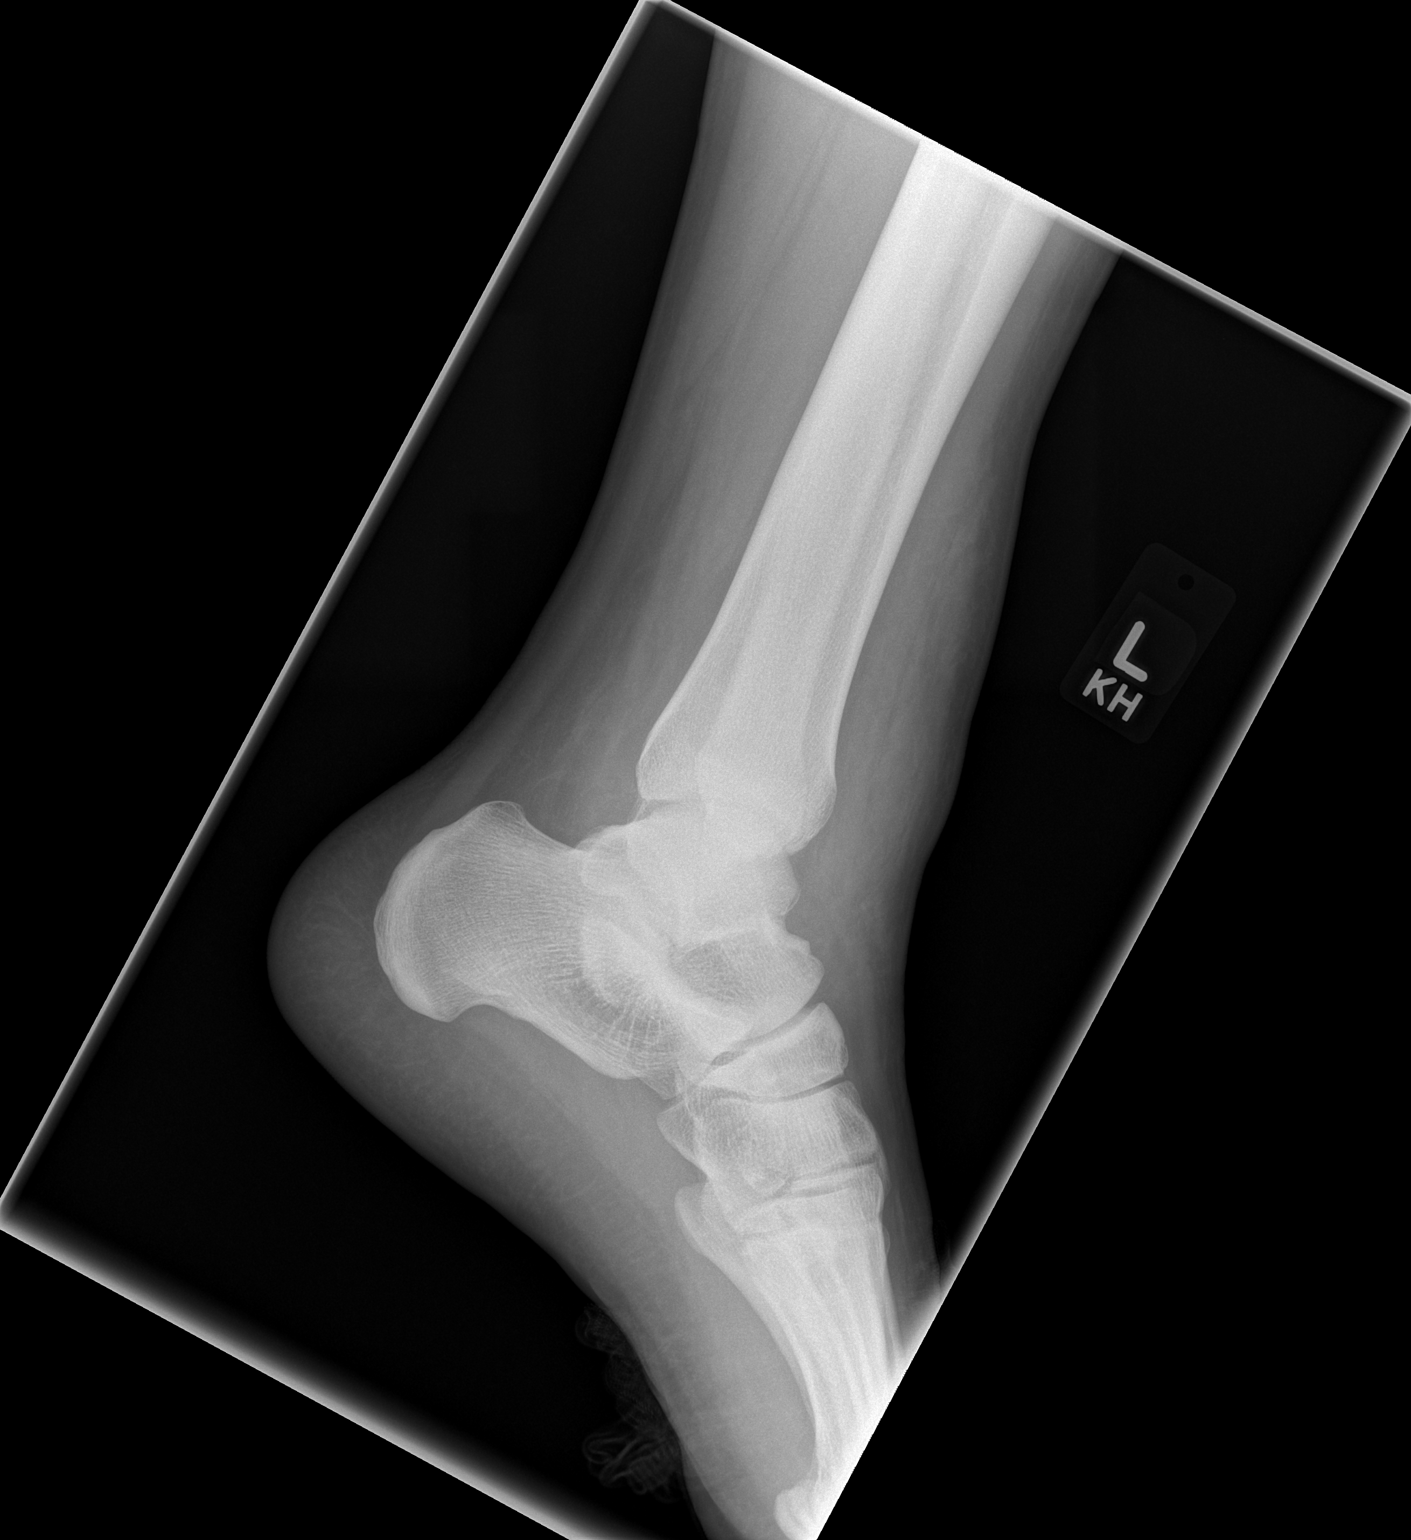

[3 of 3 positions shown; findings below may reference images not displayed]

FINDINGS: Acute nondisplaced fracture involving the medial malleolus. Acute
avulsion fracture at the tip of the fibular malleolus. Marked
lateral soft tissue swelling. Posterior malleolus without definitive
fracture lucency. Ankle mortise is symmetric.
IMPRESSION: Acute bimalleolar fracture with considerable soft tissue swelling

## 2022-08-18 ENCOUNTER — Ambulatory Visit (HOSPITAL_COMMUNITY)
Admission: EM | Admit: 2022-08-18 | Discharge: 2022-08-18 | Disposition: A | Discharge status: Home / Self Care | Payer: BC Managed Care – PPO | Attending: Emergency Medicine | Admitting: Emergency Medicine

## 2022-08-18 ENCOUNTER — Encounter (HOSPITAL_COMMUNITY): Payer: Self-pay

## 2022-08-18 DIAGNOSIS — J40 Bronchitis, not specified as acute or chronic: Secondary | ICD-10-CM

## 2022-08-18 DIAGNOSIS — J069 Acute upper respiratory infection, unspecified: Secondary | ICD-10-CM

## 2022-08-18 MED ORDER — PREDNISONE 10 MG (21) PO TBPK: ORAL_TABLET | Freq: Every day | ORAL | 0 refills | Status: DC

## 2022-08-18 MED ORDER — FLUTICASONE PROPIONATE 50 MCG/ACT NA SUSP: 2.0000 | Freq: Every day | NASAL | 2 refills | Status: DC

## 2022-08-18 NOTE — ED Triage Notes (Signed)
Pt presents to the office for cough and nasal congestion. Pt stated he taking Mucinex.

## 2022-08-18 NOTE — ED Provider Notes (Signed)
[facility]    CSN: [id number] Arrival date & time: 08/18/22  1013      History   Chief Complaint Chief Complaint  Patient presents with   Nasal Congestion   Cough    HPI Bruce Hickman is a 22 y.o. male.   Patient presents to the clinic for ongoing cough and nasal congestion that started Tuesday.  Since Tuesday he feels like his symptoms have been improving, but is still coughing up mucus.  Reports Thursday his congestion started clearing up, got his taste of sense and smell back on Wednesday.  He denies fevers or shortness of breath.  Feels like sometimes he does wheeze with his coughing.  Denies any history of asthma.    The history is provided by the patient and medical records.  Cough Associated symptoms: rhinorrhea and wheezing   Associated symptoms: no chest pain, no eye discharge, no fever, no shortness of breath and no sore throat     History reviewed. No pertinent past medical history.  Patient Active Problem List   Diagnosis Date Noted   Poor sleep hygiene 05/23/2017   Well child check 12/01/2013    History reviewed. No pertinent surgical history.     Home Medications    Prior to Admission medications   Medication Sig Start Date End Date Taking? Authorizing Provider  fluticasone (FLONASE) 50 MCG/ACT nasal spray Place 2 sprays into both nostrils daily. 08/18/22  Yes Rinaldo Ratel, [place] N, FNP  predniSONE (STERAPRED UNI-PAK 21 TAB) 10 MG (21) TBPK tablet Take by mouth daily. Take 6 tabs by mouth daily  for 2 days, then 5 tabs for 2 days, then 4 tabs for 2 days, then 3 tabs for 2 days, 2 tabs for 2 days, then 1 tab by mouth daily for 2 days 08/18/22  Yes Rinaldo Ratel, [place] N, FNP  amoxicillin-clavulanate (AUGMENTIN) 875-125 MG tablet Take 1 tablet by mouth every 12 (twelve) hours. 03/11/22   Harris, Cammy Copa, PA-C  ibuprofen (ADVIL) 800 MG tablet Take 1 tablet (800 mg total) by mouth 3 (three) times daily. 07/31/20   Charlynne Pander, MD  Multiple  Vitamins-Minerals (MULTIVITAMIN ADULTS PO) Take 1 tablet by mouth daily.    [provider]  naproxen (NAPROSYN) 375 MG tablet Take 1 tablet (375 mg total) by mouth 2 (two) times daily with a meal. 03/11/22   Harris, Abigail, PA-C  oxyCODONE-acetaminophen (PERCOCET) 5-325 MG tablet Take 1 tablet by mouth every 4 (four) hours as needed. 07/31/20   Charlynne Pander, MD    Family History Family History  Problem Relation Age of Onset   High blood pressure Father    Other Father        R hand surgery   Sleep apnea Father    Diabetes Paternal Grandmother    High blood pressure Paternal Grandmother    Other Paternal Grandfather        prediabetes    Social History Social History   Tobacco Use   Smoking status: Passive Smoke Exposure - Never Smoker   Smokeless tobacco: Never  Substance Use Topics   Alcohol use: Never   Drug use: Yes    Types: Marijuana     Allergies   No known allergies   Review of Systems Review of Systems  Constitutional:  Negative for fatigue and fever.  HENT:  Positive for congestion, postnasal drip and rhinorrhea. Negative for sore throat and voice change.   Eyes:  Negative for discharge.  Respiratory:  Positive for cough and  wheezing. Negative for shortness of breath.   Cardiovascular:  Negative for chest pain.  Gastrointestinal:  Negative for abdominal pain.     Physical Exam Triage Vital Signs ED Triage Vitals  Enc Vitals Group     BP 08/18/22 1107 131/70     Pulse Rate 08/18/22 1107 67     Resp 08/18/22 1114 16     Temp 08/18/22 1107 98.3 F (36.8 C)     Temp Source 08/18/22 1107 Oral     SpO2 08/18/22 1107 97 %     Weight --      Height --      Head Circumference --      Peak Flow --      Pain Score 08/18/22 1116 0     Pain Loc --      Pain Edu? --      Excl. in GC? --    No data found.  Updated Vital Signs BP 131/70 (BP Location: Left Arm)   Pulse 67   Temp 98.3 F (36.8 C) (Oral)   Resp 16   SpO2 97%   Visual  Acuity Right Eye Distance:   Left Eye Distance:   Bilateral Distance:    Right Eye Near:   Left Eye Near:    Bilateral Near:     Physical Exam Vitals and nursing note reviewed.  Constitutional:      General: He is not in acute distress.    Appearance: He is well-developed.  HENT:     Head: Normocephalic and atraumatic.     Right Ear: External ear normal.     Left Ear: External ear normal.     Nose: Congestion and rhinorrhea present.     Mouth/Throat:     Mouth: Mucous membranes are moist.     Pharynx: Posterior oropharyngeal erythema present.  Eyes:     Conjunctiva/sclera: Conjunctivae normal.  Cardiovascular:     Rate and Rhythm: Normal rate and regular rhythm.     Heart sounds: Normal heart sounds. No murmur heard. Pulmonary:     Effort: Pulmonary effort is normal. No respiratory distress.     Breath sounds: Wheezing present.  Musculoskeletal:        General: No swelling. Normal range of motion.     Cervical back: Normal range of motion and neck supple.  Skin:    General: Skin is warm and dry.     Capillary Refill: Capillary refill takes less than 2 seconds.  Neurological:     Mental Status: He is alert.  Psychiatric:        Mood and Affect: Mood normal.      UC Treatments / Results  Labs (all labs ordered are listed, but only abnormal results are displayed) Labs Reviewed - No data to display  EKG   Radiology No results found.  Procedures Procedures (including critical care time)  Medications Ordered in UC Medications - No data to display  Initial Impression / Assessment and Plan / UC Course  I have reviewed the triage vital signs and the nursing notes.  Pertinent labs & imaging results that were available during my care of the patient were reviewed by me and considered in my medical decision making (see chart for details).  Vitals and triage reviewed, patient is hemodynamically stable.  Oxygenation 97% on room air, no acute distress.  Upper lobe  expiratory wheezing, nasal congestion, rhinorrhea, postnasal drip and posterior pharynx erythema.  Feel as if symptoms are consistent with a viral upper  respiratory infection and bronchitis.  Will cover with steroid taper and symptomatic measures.  Encouraged to follow-up if no improvement.  Return emergency precautions discussed, no questions at this time.     Final Clinical Impressions(s) / UC Diagnoses   Final diagnoses:  Viral URI with cough  Bronchitis     Discharge Instructions      Overall your symptoms are consistent with a viral upper respiratory infection, I believe this is triggered some bronchitis due to your upper airway wheezing on exam.  Please take the steroid Dosepak starting today and then in the morning with breakfast.  You can continue to do the Mucinex, 1200 mg a day with at least 64 ounces of water to help loosen your secretions.  You can do the Flonase nasal spray as well with the nasal congestion.  Please return to clinic or to your primary care provider if you have no improvement in your symptoms over the next 7 to 10 days.  Please seek immediate care if you develop wheezing, shortness of breath, or any new concerning symptoms.      ED Prescriptions     Medication Sig Dispense Auth. Provider   predniSONE (STERAPRED UNI-PAK 21 TAB) 10 MG (21) TBPK tablet Take by mouth daily. Take 6 tabs by mouth daily  for 2 days, then 5 tabs for 2 days, then 4 tabs for 2 days, then 3 tabs for 2 days, 2 tabs for 2 days, then 1 tab by mouth daily for 2 days 42 tablet Rinaldo Ratel, [place] N, FNP   fluticasone [facility]) 50 MCG/ACT nasal spray Place 2 sprays into both nostrils daily. 9.9 mL Darren Nodal, [place] N, FNP      PDMP not reviewed this encounter.   Ladelle Teodoro, [place] N, [place] 08/18/22 1140

## 2022-08-18 NOTE — Discharge Instructions (Signed)
Overall your symptoms are consistent with a viral upper respiratory infection, I believe this is triggered some bronchitis due to your upper airway wheezing on exam.  Please take the steroid Dosepak starting today and then in the morning with breakfast.  You can continue to do the Mucinex, 1200 mg a day with at least 64 ounces of water to help loosen your secretions.  You can do the Flonase nasal spray as well with the nasal congestion.  Please return to clinic or to your primary care provider if you have no improvement in your symptoms over the next 7 to 10 days.  Please seek immediate care if you develop wheezing, shortness of breath, or any new concerning symptoms.

## 2023-06-28 ENCOUNTER — Other Ambulatory Visit: Payer: Self-pay

## 2023-06-28 ENCOUNTER — Ambulatory Visit
Admission: RE | Admit: 2023-06-28 | Discharge: 2023-06-28 | Disposition: A | Discharge status: Home / Self Care | Payer: Self-pay | Source: Ambulatory Visit | Attending: Family Medicine | Admitting: Family Medicine

## 2023-06-28 VITALS — BP 132/84 | HR 69 | Temp 97.9°F | Resp 18 | Ht 78.0 in | Wt 212.0 lb

## 2023-06-28 DIAGNOSIS — R079 Chest pain, unspecified: Secondary | ICD-10-CM | POA: Diagnosis not present

## 2023-06-28 NOTE — ED Provider Notes (Signed)
 [facility]    CSN: [id number] Arrival date & time: 06/28/23  4098      History   Chief Complaint Chief Complaint  Patient presents with   Chest Pain    HPI Octavian Godek is a 23 y.o. male.    Chest Pain Associated symptoms: no abdominal pain, no cough, no diaphoresis, no dizziness, no fatigue, no fever, no shortness of breath and no vomiting   Left-sided chest pain described as a tightness that lasts for 2 to 3 hours.  Had an episode last p.m. has had similar episodes over the last few months.  First episode was associated with a panic attack, last night's episode started while he was smoking marijuana.  He smokes marijuana regularly, also does some psychedelics.  Denies other drugs, cigarettes and alcohol. When he gets the tightness in his chest it makes him feel anxious as it raises concerns for his health.  Rates pain 3 out of 10 at its worst Denies associated symptoms including dizziness or lightheadedness, shortness of breath waiting, abdominal pain, nausea, vomiting.  Denies significant past medical history, no daily medications, no known drug allergies.  Denies significant family history including coronary artery disease, sudden cardiac death and arrhythmias  History reviewed. No pertinent past medical history.  Patient Active Problem List   Diagnosis Date Noted   Poor sleep hygiene 05/23/2017   Well child check 12/01/2013    History reviewed. No pertinent surgical history.     Home Medications    Prior to Admission medications   Medication Sig Start Date End Date Taking? Authorizing Provider  amoxicillin-clavulanate (AUGMENTIN) 875-125 MG tablet Take 1 tablet by mouth every 12 (twelve) hours. 03/11/22   Harris, Abigail, PA-C  fluticasone (FLONASE) 50 MCG/ACT nasal spray Place 2 sprays into both nostrils daily. 08/18/22   Garrison, [place] N, FNP  ibuprofen (ADVIL) 800 MG tablet Take 1 tablet (800 mg total) by mouth 3 (three) times daily. 07/31/20   Charlynne Pander, MD  Multiple Vitamins-Minerals (MULTIVITAMIN ADULTS PO) Take 1 tablet by mouth daily.    [provider]  naproxen (NAPROSYN) 375 MG tablet Take 1 tablet (375 mg total) by mouth 2 (two) times daily with a meal. 03/11/22   Harris, Abigail, PA-C  oxyCODONE-acetaminophen (PERCOCET) 5-325 MG tablet Take 1 tablet by mouth every 4 (four) hours as needed. 07/31/20   Charlynne Pander, MD  predniSONE (STERAPRED UNI-PAK 21 TAB) 10 MG (21) TBPK tablet Take by mouth daily. Take 6 tabs by mouth daily  for 2 days, then 5 tabs for 2 days, then 4 tabs for 2 days, then 3 tabs for 2 days, 2 tabs for 2 days, then 1 tab by mouth daily for 2 days 08/18/22   Garrison, [place] N, FNP    Family History Family History  Problem Relation Age of Onset   High blood pressure Father    Other Father        R hand surgery   Sleep apnea Father    Diabetes Paternal Grandmother    High blood pressure Paternal Grandmother    Other Paternal Grandfather        prediabetes    Social History Social History   Tobacco Use   Smoking status: Every Day    Types: Cigarettes    Passive exposure: Yes   Smokeless tobacco: Never  Vaping Use   Vaping status: Never Used  Substance Use Topics   Alcohol use: Yes    Comment: special occasions  Drug use: Yes    Types: Marijuana     Allergies   No known allergies   Review of Systems Review of Systems  Constitutional:  Negative for chills, diaphoresis, fatigue and fever.  Respiratory:  Positive for chest tightness. Negative for cough, shortness of breath and wheezing.   Cardiovascular:  Positive for chest pain.  Gastrointestinal:  Negative for abdominal pain, diarrhea and vomiting.  Skin:  Negative for rash.  Neurological:  Negative for dizziness and light-headedness.     Physical Exam Triage Vital Signs ED Triage Vitals  Encounter Vitals Group     BP 06/28/23 0858 132/84     Systolic BP Percentile --      Diastolic BP Percentile --       Pulse Rate 06/28/23 0858 69     Resp 06/28/23 0858 18     Temp 06/28/23 0858 97.9 F (36.6 C)     Temp Source 06/28/23 0858 Oral     SpO2 06/28/23 0858 99 %     Weight 06/28/23 0856 212 lb (96.2 kg)     Height 06/28/23 0856 6\' 6"  (1.981 m)     Head Circumference --      Peak Flow --      Pain Score 06/28/23 0855 2     Pain Loc --      Pain Education --      Exclude from Growth Chart --    No data found.  Updated Vital Signs BP 132/84 (BP Location: Right Arm)   Pulse 69   Temp 97.9 F (36.6 C) (Oral)   Resp 18   Ht 6\' 6"  (1.981 m)   Wt 212 lb (96.2 kg)   SpO2 99%   BMI 24.50 kg/m   Visual Acuity Right Eye Distance:   Left Eye Distance:   Bilateral Distance:    Right Eye Near:   Left Eye Near:    Bilateral Near:     Physical Exam Vitals and nursing note reviewed.      UC Treatments / Results  Labs (all labs ordered are listed, but only abnormal results are displayed) Labs Reviewed - No data to display  EKG EKG independently viewed by me normal sinus rhythm with a rate of 61 with sinus arrhythmia, J-point elevation noted lead to V5 and V2 suggestive of early repolarization no acute abnormalities  Radiology No results found.  Procedures Procedures (including critical care time)  Medications Ordered in UC Medications - No data to display  Initial Impression / Assessment and Plan / UC Course  I have reviewed the triage vital signs and the nursing notes.  Pertinent labs & imaging results that were available during my care of the patient were reviewed by me and considered in my medical decision making (see chart for details).     23 year old male nonspecific left-sided chest pain normal exam stable EKG recommend he follow-up with his PCP ED for severe symptoms or concerns Final Clinical Impressions(s) / UC Diagnoses   Final diagnoses:  Chest pain, unspecified type   Discharge Instructions   None    ED Prescriptions   None    PDMP not reviewed  this encounter.   Meliton Rattan, [place] 06/28/23 [id number]

## 2023-06-28 NOTE — ED Triage Notes (Signed)
 Pt presents with complaints of chest pain, mainly on left side for the past few months however has worsened over the last few days. Pt does state he has little congestion. Pt currently rates his overall pain a 2/10. Pt states he has been taking Vitamin D and drinking more fluids with some improvement.

## 2023-10-02 ENCOUNTER — Ambulatory Visit
Admission: RE | Admit: 2023-10-02 | Discharge: 2023-10-02 | Disposition: A | Discharge status: Home / Self Care | Source: Ambulatory Visit | Attending: Family Medicine | Admitting: Family Medicine

## 2023-10-02 VITALS — BP 126/67 | HR 92 | Temp 99.0°F | Resp 16

## 2023-10-02 DIAGNOSIS — Z113 Encounter for screening for infections with a predominantly sexual mode of transmission: Secondary | ICD-10-CM | POA: Insufficient documentation

## 2023-10-02 NOTE — ED Triage Notes (Signed)
 Pt requesting STD testing-denies sx and known exposure-NAD-steady gait

## 2023-10-02 NOTE — Discharge Instructions (Signed)
 Bacterial vaginosis is not an infection you can give your sex partner. We will test you for all other sexually transmitted infections and let you know if you need any treatment for positive results.

## 2023-10-02 NOTE — ED Provider Notes (Signed)
  [facility]  Note:  This document was prepared using Conservation officer, historic buildings and may include unintentional dictation errors.  MRN: [id number] DOB: [birth year]  Subjective:   Nashon Erbes is a 23 y.o. male presenting for complete sexually-transmitted infection testing.  Reports that his girlfriend tested positive for bacterial vaginosis.  Denies fever, n/v, abdominal pain, pelvic pain, rashes, dysuria, urinary frequency, hematuria, vaginal discharge.  No known sexually transmitted infection exposures.   No current facility-administered medications for this encounter. No current outpatient medications on file.   No Known Allergies  History reviewed. No pertinent past medical history.   History reviewed. No pertinent surgical history.  Family History  Problem Relation Age of Onset   High blood pressure Father    Other Father        R hand surgery   Sleep apnea Father    Diabetes Paternal Grandmother    High blood pressure Paternal Grandmother    Other Paternal Grandfather        prediabetes    Social History   Tobacco Use   Smoking status: Never    Passive exposure: Yes   Smokeless tobacco: Never  Vaping Use   Vaping status: Never Used  Substance Use Topics   Alcohol use: Yes    Comment: occ   Drug use: Yes    Types: Marijuana    ROS   Objective:   Vitals: BP 126/67 (BP Location: Right Arm)   Pulse 92   Temp 99 F (37.2 C) (Oral)   Resp 16   SpO2 95%   Physical Exam Constitutional:      General: He is not in acute distress.    Appearance: Normal appearance. He is well-developed and normal weight. He is not ill-appearing, toxic-appearing or diaphoretic.  HENT:     Head: Normocephalic and atraumatic.     Right Ear: External ear normal.     Left Ear: External ear normal.     Nose: Nose normal.     Mouth/Throat:     Pharynx: Oropharynx is clear.  Eyes:     General: No scleral icterus.       Right eye: No discharge.         Left eye: No discharge.     Extraocular Movements: Extraocular movements intact.  Cardiovascular:     Rate and Rhythm: Normal rate.  Pulmonary:     Effort: Pulmonary effort is normal.  Musculoskeletal:     Cervical back: Normal range of motion.  Neurological:     Mental Status: He is alert and oriented to person, place, and time.  Psychiatric:        Mood and Affect: Mood normal.        Behavior: Behavior normal.        Thought Content: Thought content normal.        Judgment: Judgment normal.     Assessment and Plan :   PDMP not reviewed this encounter.  1. Screen for STD (sexually transmitted disease)    Sexually transmitted infection testing pending.  Will treat as appropriate based off of lab results.   Adolph Hoop, [place] 10/02/23 1953

## 2023-10-03 LAB — CYTOLOGY, (ORAL, ANAL, URETHRAL) ANCILLARY ONLY
Chlamydia: NEGATIVE
Comment: NEGATIVE
Comment: NEGATIVE
Comment: NORMAL
Neisseria Gonorrhea: NEGATIVE
Trichomonas: NEGATIVE

## 2023-10-07 ENCOUNTER — Ambulatory Visit

## 2023-10-09 ENCOUNTER — Ambulatory Visit
Admission: RE | Admit: 2023-10-09 | Discharge: 2023-10-09 | Disposition: A | Discharge status: Home / Self Care | Source: Ambulatory Visit | Attending: Internal Medicine

## 2023-10-09 ENCOUNTER — Other Ambulatory Visit: Payer: Self-pay

## 2023-10-09 VITALS — BP 115/71 | HR 86 | Temp 97.9°F | Resp 17

## 2023-10-09 DIAGNOSIS — R0602 Shortness of breath: Secondary | ICD-10-CM

## 2023-10-09 DIAGNOSIS — R431 Parosmia: Secondary | ICD-10-CM | POA: Diagnosis not present

## 2023-10-09 MED ORDER — CETIRIZINE HCL 10 MG PO TABS: 10.0000 mg | ORAL_TABLET | Freq: Every day | ORAL | 1 refills | Status: AC

## 2023-10-09 MED ORDER — ALBUTEROL SULFATE HFA 108 (90 BASE) MCG/ACT IN AERS: 1.0000 | INHALATION_SPRAY | Freq: Four times a day (QID) | RESPIRATORY_TRACT | 3 refills | Status: AC | PRN

## 2023-10-09 NOTE — Discharge Instructions (Addendum)
 Your physical exam findings and vital signs are reassuring.  The symptoms may be associated with hypersensitivity to certain odors and possibly some mild inflammation that can occur with this.  This has been going on for 6 months now.  We can try to treat with medication to help with opening the airways when you are exposed to certain odors but if symptoms do not improve then you will need to follow-up with your primary care provider to discuss the symptoms.  We will treat with the following: Zyrtec 10 mg daily.  This medication can help with hypersensitivity reactions Albuterol inhaler 1-2 puffs every 6 hours as needed for wheezing/shortness of breath.  If symptoms do not improve within the next 1 to 2 weeks then recommend following up with your primary care physician If symptoms worsen then return to urgent care or go to the emergency room

## 2023-10-09 NOTE — ED Triage Notes (Signed)
 Pt c/o SOB. Pt states he has int trouble taking a deep [id number]. PT able to speak in complete sentences

## 2023-10-09 NOTE — ED Provider Notes (Signed)
 [facility]    CSN: [id number] Arrival date & time: 10/09/23  1844      History   Chief Complaint Chief Complaint  Patient presents with   Shortness of Breath    HPI Bruce Hickman is a 23 y.o. male.   23 year old male who presents urgent care with complaints of 6 months of shortness of breath.  He reports that this is not all the time.  He reports that the shortness of breath comes with certain odors such as tobacco or chemicals.  He is able to run without issue and workout without issue.  The symptoms are not present unless he has a smell present.  The smells will make you feel like he is short of breath and feel tight in his chest.  He has brought this up to his physicians before but he has not had any treatment.  He does not take any medication for it.  He reports that when he drinks naked juice he feels better.  Removing himself from the odors helps as well.    Shortness of Breath Associated symptoms: no abdominal pain, no chest pain, no cough, no ear pain, no fever, no rash, no sore throat and no vomiting     History reviewed. No pertinent past medical history.  Patient Active Problem List   Diagnosis Date Noted   Poor sleep hygiene 05/23/2017   Well child check 12/01/2013    History reviewed. No pertinent surgical history.     Home Medications    Prior to Admission medications   Medication Sig Start Date End Date Taking? Authorizing Provider  albuterol (VENTOLIN HFA) 108 (90 Base) MCG/ACT inhaler Inhale 1-2 puffs into the lungs every 6 (six) hours as needed for wheezing or shortness of breath. 10/09/23  Yes Kell Ferris A, PA-C  cetirizine (ZYRTEC ALLERGY) 10 MG tablet Take 1 tablet (10 mg total) by mouth daily. 10/09/23  Yes Kreg Pesa, PA-C    Family History Family History  Problem Relation Age of Onset   High blood pressure Father    Other Father        R hand surgery   Sleep apnea Father    Diabetes Paternal Grandmother    High  blood pressure Paternal Grandmother    Other Paternal Grandfather        prediabetes    Social History Social History   Tobacco Use   Smoking status: Never    Passive exposure: Yes   Smokeless tobacco: Never  Vaping Use   Vaping status: Never Used  Substance Use Topics   Alcohol use: Yes    Comment: occ   Drug use: Yes    Types: Marijuana     Allergies   Patient has no known allergies.   Review of Systems Review of Systems  Constitutional:  Negative for chills and fever.  HENT:  Negative for ear pain and sore throat.   Eyes:  Negative for pain and visual disturbance.  Respiratory:  Positive for chest tightness and shortness of breath. Negative for cough.   Cardiovascular:  Negative for chest pain and palpitations.  Gastrointestinal:  Negative for abdominal pain and vomiting.  Genitourinary:  Negative for dysuria and hematuria.  Musculoskeletal:  Negative for arthralgias and back pain.  Skin:  Negative for color change and rash.  Neurological:  Negative for seizures and syncope.  All other systems reviewed and are negative.    Physical Exam Triage Vital Signs ED Triage Vitals  Encounter Vitals Group  BP 10/09/23 1852 115/71     Girls Systolic BP Percentile --      Girls Diastolic BP Percentile --      Boys Systolic BP Percentile --      Boys Diastolic BP Percentile --      Pulse Rate 10/09/23 1852 86     Resp 10/09/23 1852 17     Temp 10/09/23 1852 97.9 F (36.6 C)     Temp Source 10/09/23 1852 Oral     SpO2 10/09/23 1852 96 %     Weight --      Height --      Head Circumference --      Peak Flow --      Pain Score 10/09/23 1851 0     Pain Loc --      Pain Education --      Exclude from Growth Chart --    No data found.  Updated Vital Signs BP 115/71   Pulse 86   Temp 97.9 F (36.6 C) (Oral)   Resp 17   SpO2 96%   Visual Acuity Right Eye Distance:   Left Eye Distance:   Bilateral Distance:    Right Eye Near:   Left Eye Near:     Bilateral Near:     Physical Exam Vitals and nursing note reviewed.  Constitutional:      General: He is not in acute distress.    Appearance: He is well-developed.  HENT:     Head: Normocephalic and atraumatic.   Eyes:     Conjunctiva/sclera: Conjunctivae normal.    Cardiovascular:     Rate and Rhythm: Normal rate and regular rhythm.     Heart sounds: No murmur heard. Pulmonary:     Effort: Pulmonary effort is normal. No respiratory distress.     Breath sounds: Normal breath sounds.  Abdominal:     Palpations: Abdomen is soft.     Tenderness: There is no abdominal tenderness.   Musculoskeletal:        General: No swelling.     Cervical back: Neck supple.   Skin:    General: Skin is warm and dry.     Capillary Refill: Capillary refill takes less than 2 seconds.   Neurological:     Mental Status: He is alert.   Psychiatric:        Mood and Affect: Mood normal.      UC Treatments / Results  Labs (all labs ordered are listed, but only abnormal results are displayed) Labs Reviewed - No data to display  EKG   Radiology No results found.  Procedures Procedures (including critical care time)  Medications Ordered in UC Medications - No data to display  Initial Impression / Assessment and Plan / UC Course  I have reviewed the triage vital signs and the nursing notes.  Pertinent labs & imaging results that were available during my care of the patient were reviewed by me and considered in my medical decision making (see chart for details).     Shortness of breath  Sensitive to smells   The patient's symptoms are exacerbated by odors including smoke and chemical odors.  His physical exam findings and vital signs are reassuring.  The symptoms may be associated with hypersensitivity to certain odors and possibly some mild inflammation that can occur with this.  This has been going on for 6 months now.  We can try to treat with medication to help with opening  the airways when  you are exposed to certain odors but if symptoms do not improve then you will need to follow-up with your primary care provider to discuss the symptoms.  We will treat with the following: Zyrtec 10 mg daily.  This medication can help with hypersensitivity reactions Albuterol inhaler 1-2 puffs every 6 hours as needed for wheezing/shortness of breath.  If symptoms do not improve within the next 1 to 2 weeks then recommend following up with your primary care physician If symptoms worsen then return to urgent care or go to the emergency room  Final Clinical Impressions(s) / UC Diagnoses   Final diagnoses:  Shortness of breath  Sensitive to smells     Discharge Instructions      Your physical exam findings and vital signs are reassuring.  The symptoms may be associated with hypersensitivity to certain odors and possibly some mild inflammation that can occur with this.  This has been going on for 6 months now.  We can try to treat with medication to help with opening the airways when you are exposed to certain odors but if symptoms do not improve then you will need to follow-up with your primary care provider to discuss the symptoms.  We will treat with the following: Zyrtec 10 mg daily.  This medication can help with hypersensitivity reactions Albuterol inhaler 1-2 puffs every 6 hours as needed for wheezing/shortness of breath.  If symptoms do not improve within the next 1 to 2 weeks then recommend following up with your primary care physician If symptoms worsen then return to urgent care or go to the emergency room     ED Prescriptions     Medication Sig Dispense Auth. Provider   albuterol (VENTOLIN HFA) 108 (90 Base) MCG/ACT inhaler Inhale 1-2 puffs into the lungs every 6 (six) hours as needed for wheezing or shortness of breath. 6.7 g Marland Reine A, PA-C   cetirizine (ZYRTEC ALLERGY) 10 MG tablet Take 1 tablet (10 mg total) by mouth daily. 30 tablet Kreg Pesa, [place]      PDMP not reviewed this encounter.   Kreg Pesa, [place] 10/09/23 1913

## 2024-01-09 ENCOUNTER — Encounter (HOSPITAL_COMMUNITY)
Admission: EM | Disposition: A | Discharge status: Home / Self Care | Payer: Self-pay | Source: Home / Self Care | Attending: Emergency Medicine

## 2024-01-09 ENCOUNTER — Other Ambulatory Visit: Payer: Self-pay

## 2024-01-09 ENCOUNTER — Encounter (HOSPITAL_COMMUNITY): Payer: Self-pay

## 2024-01-09 ENCOUNTER — Emergency Department (HOSPITAL_COMMUNITY): Admitting: Certified Registered Nurse Anesthetist

## 2024-01-09 ENCOUNTER — Ambulatory Visit (HOSPITAL_COMMUNITY)
Admission: EM | Admit: 2024-01-09 | Discharge: 2024-01-09 | Disposition: A | Discharge status: Home / Self Care | Attending: Emergency Medicine | Admitting: Emergency Medicine

## 2024-01-09 ENCOUNTER — Emergency Department (HOSPITAL_COMMUNITY)

## 2024-01-09 DIAGNOSIS — N4400 Torsion of testis, unspecified: Secondary | ICD-10-CM | POA: Insufficient documentation

## 2024-01-09 HISTORY — PX: ORCHIECTOMY: SHX2116

## 2024-01-09 HISTORY — PX: SCROTAL EXPLORATION: SHX2386

## 2024-01-09 HISTORY — DX: Other specified health status: Z78.9

## 2024-01-09 HISTORY — PX: ORCHIOPEXY: SHX479

## 2024-01-09 LAB — COMPREHENSIVE METABOLIC PANEL WITH GFR
ALT: 16 U/L (ref 0–44)
AST: 24 U/L (ref 15–41)
Albumin: 4.3 g/dL (ref 3.5–5.0)
Alkaline Phosphatase: 53 U/L (ref 38–126)
Anion gap: 16 — ABNORMAL HIGH (ref 5–15)
BUN: 12 mg/dL (ref 6–20)
CO2: 24 mmol/L (ref 22–32)
Calcium: 9.5 mg/dL (ref 8.9–10.3)
Chloride: 97 mmol/L — ABNORMAL LOW (ref 98–111)
Creatinine, Ser: 1.16 mg/dL (ref 0.61–1.24)
GFR, Estimated: >60 mL/min (ref >60)
Glucose, Bld: 143 mg/dL — ABNORMAL HIGH (ref 70–99)
Potassium: 3.8 mmol/L (ref 3.5–5.1)
Sodium: 137 mmol/L (ref 135–145)
Total Bilirubin: 1.7 mg/dL — ABNORMAL HIGH (ref 0.0–1.2)
Total Protein: 7.2 g/dL (ref 6.5–8.1)

## 2024-01-09 LAB — CBC
HCT: 49.1 % (ref 39.0–52.0)
Hemoglobin: 16.4 g/dL (ref 13.0–17.0)
MCH: 28.7 pg (ref 26.0–34.0)
MCHC: 33.4 g/dL (ref 30.0–36.0)
MCV: 86 fL (ref 80.0–100.0)
Platelets: 270 K/uL (ref 150–400)
RBC: 5.71 MIL/uL (ref 4.22–5.81)
RDW: 14.3 % (ref 11.5–15.5)
WBC: 9.1 K/uL (ref 4.0–10.5)
nRBC: 0 % (ref 0.0–0.2)

## 2024-01-09 SURGERY — EXPLORATION, SCROTUM
Anesthesia: General | Site: Scrotum | Laterality: Right

## 2024-01-09 MED ORDER — ONDANSETRON HCL 4 MG/2ML IJ SOLN
INTRAMUSCULAR | Status: AC
  Filled 2024-01-09: qty 2

## 2024-01-09 MED ORDER — SUGAMMADEX SODIUM 200 MG/2ML IV SOLN
INTRAVENOUS | Status: DC | PRN
  Administered 2024-01-09: 200 mg via INTRAVENOUS

## 2024-01-09 MED ORDER — AMISULPRIDE (ANTIEMETIC) 5 MG/2ML IV SOLN: 10.0000 mg | Freq: Once | INTRAVENOUS | Status: DC | PRN

## 2024-01-09 MED ORDER — SUCCINYLCHOLINE CHLORIDE 200 MG/10ML IV SOSY
PREFILLED_SYRINGE | INTRAVENOUS | Status: DC | PRN
  Administered 2024-01-09: 120 mg via INTRAVENOUS

## 2024-01-09 MED ORDER — ROCURONIUM BROMIDE 10 MG/ML (PF) SYRINGE
PREFILLED_SYRINGE | INTRAVENOUS | Status: AC
  Filled 2024-01-09: qty 10

## 2024-01-09 MED ORDER — FENTANYL CITRATE (PF) 100 MCG/2ML IJ SOLN: 25.0000 ug | INTRAMUSCULAR | Status: DC | PRN

## 2024-01-09 MED ORDER — FENTANYL CITRATE PF 50 MCG/ML IJ SOSY: 50.0000 ug | PREFILLED_SYRINGE | Freq: Once | INTRAMUSCULAR | Status: DC

## 2024-01-09 MED ORDER — ONDANSETRON HCL 4 MG/2ML IJ SOLN
4.0000 mg | Freq: Once | INTRAMUSCULAR | Status: AC
  Administered 2024-01-09: 4 mg via INTRAVENOUS
  Filled 2024-01-09: qty 2

## 2024-01-09 MED ORDER — LIDOCAINE 2% (20 MG/ML) 5 ML SYRINGE
INTRAMUSCULAR | Status: AC
  Filled 2024-01-09: qty 5

## 2024-01-09 MED ORDER — DEXAMETHASONE SODIUM PHOSPHATE 10 MG/ML IJ SOLN
INTRAMUSCULAR | Status: AC
  Filled 2024-01-09: qty 1

## 2024-01-09 MED ORDER — ONDANSETRON HCL 4 MG/2ML IJ SOLN
INTRAMUSCULAR | Status: DC | PRN
  Administered 2024-01-09: 4 mg via INTRAVENOUS

## 2024-01-09 MED ORDER — OXYCODONE HCL 5 MG PO TABS: 5.0000 mg | ORAL_TABLET | Freq: Once | ORAL | Status: DC | PRN

## 2024-01-09 MED ORDER — OXYCODONE HCL 5 MG PO TABS: 5.0000 mg | ORAL_TABLET | Freq: Three times a day (TID) | ORAL | 0 refills | Status: AC | PRN

## 2024-01-09 MED ORDER — ACETAMINOPHEN 500 MG PO TABS
1000.0000 mg | ORAL_TABLET | Freq: Four times a day (QID) | ORAL | 2 refills | Status: AC | PRN
Start: 2024-01-09 — End: 2025-01-08

## 2024-01-09 MED ORDER — LIDOCAINE 2% (20 MG/ML) 5 ML SYRINGE
INTRAMUSCULAR | Status: DC | PRN
  Administered 2024-01-09: 60 mg via INTRAVENOUS

## 2024-01-09 MED ORDER — PROPOFOL 10 MG/ML IV BOLUS
INTRAVENOUS | Status: DC | PRN
  Administered 2024-01-09: 200 mg via INTRAVENOUS

## 2024-01-09 MED ORDER — DEXMEDETOMIDINE HCL IN NACL 80 MCG/20ML IV SOLN
INTRAVENOUS | Status: DC | PRN
Start: 2024-01-09 — End: 2024-01-09
  Administered 2024-01-09: 8 ug via INTRAVENOUS
  Administered 2024-01-09: 12 ug via INTRAVENOUS

## 2024-01-09 MED ORDER — CHLORHEXIDINE GLUCONATE 0.12 % MT SOLN: 15.0000 mL | Freq: Once | OROMUCOSAL | Status: AC

## 2024-01-09 MED ORDER — ORAL CARE MOUTH RINSE: 15.0000 mL | Freq: Once | OROMUCOSAL | Status: AC

## 2024-01-09 MED ORDER — ROCURONIUM BROMIDE 10 MG/ML (PF) SYRINGE
PREFILLED_SYRINGE | INTRAVENOUS | Status: DC | PRN
  Administered 2024-01-09: 30 mg via INTRAVENOUS

## 2024-01-09 MED ORDER — SUCCINYLCHOLINE CHLORIDE 200 MG/10ML IV SOSY
PREFILLED_SYRINGE | INTRAVENOUS | Status: AC
  Filled 2024-01-09: qty 10

## 2024-01-09 MED ORDER — CEFAZOLIN SODIUM-DEXTROSE 2-4 GM/100ML-% IV SOLN
INTRAVENOUS | Status: AC
  Filled 2024-01-09: qty 100

## 2024-01-09 MED ORDER — 0.9 % SODIUM CHLORIDE (POUR BTL) OPTIME
TOPICAL | Status: DC | PRN
Start: 2024-01-09 — End: 2024-01-09
  Administered 2024-01-09: 1000 mL

## 2024-01-09 MED ORDER — LACTATED RINGERS IV SOLN: INTRAVENOUS | Status: DC

## 2024-01-09 MED ORDER — FENTANYL CITRATE (PF) 250 MCG/5ML IJ SOLN
INTRAMUSCULAR | Status: DC | PRN
  Administered 2024-01-09 (×2): 50 ug via INTRAVENOUS
  Administered 2024-01-09: 100 ug via INTRAVENOUS

## 2024-01-09 MED ORDER — CHLORHEXIDINE GLUCONATE 0.12 % MT SOLN
OROMUCOSAL | Status: AC
  Administered 2024-01-09: 15 mL via OROMUCOSAL
  Filled 2024-01-09: qty 15

## 2024-01-09 MED ORDER — LACTATED RINGERS IV SOLN: INTRAVENOUS | Status: DC | PRN

## 2024-01-09 MED ORDER — DEXAMETHASONE SODIUM PHOSPHATE 10 MG/ML IJ SOLN
INTRAMUSCULAR | Status: DC | PRN
  Administered 2024-01-09: 8 mg via INTRAVENOUS

## 2024-01-09 MED ORDER — MIDAZOLAM HCL 2 MG/2ML IJ SOLN
INTRAMUSCULAR | Status: AC
  Filled 2024-01-09: qty 2

## 2024-01-09 MED ORDER — FENTANYL CITRATE (PF) 250 MCG/5ML IJ SOLN
INTRAMUSCULAR | Status: AC
  Filled 2024-01-09: qty 5

## 2024-01-09 MED ORDER — BUPIVACAINE HCL (PF) 0.25 % IJ SOLN
INTRAMUSCULAR | Status: AC
  Filled 2024-01-09: qty 30

## 2024-01-09 MED ORDER — SENNA 8.6 MG PO TABS: 1.0000 | ORAL_TABLET | Freq: Every day | ORAL | 0 refills | Status: AC

## 2024-01-09 MED ORDER — SODIUM CHLORIDE 0.9 % IV SOLN: 12.5000 mg | INTRAVENOUS | Status: DC | PRN

## 2024-01-09 MED ORDER — OXYCODONE HCL 5 MG/5ML PO SOLN: 5.0000 mg | Freq: Once | ORAL | Status: DC | PRN

## 2024-01-09 MED ORDER — HYDROMORPHONE HCL 1 MG/ML IJ SOLN
1.0000 mg | Freq: Once | INTRAMUSCULAR | Status: AC
  Administered 2024-01-09: 1 mg via INTRAVENOUS
  Filled 2024-01-09: qty 1

## 2024-01-09 MED ORDER — CEFAZOLIN SODIUM-DEXTROSE 2-3 GM-%(50ML) IV SOLR
INTRAVENOUS | Status: DC | PRN
Start: 2024-01-09 — End: 2024-01-09
  Administered 2024-01-09: 2 g via INTRAVENOUS

## 2024-01-09 MED ORDER — BUPIVACAINE HCL (PF) 0.25 % IJ SOLN
INTRAMUSCULAR | Status: DC | PRN
  Administered 2024-01-09: 10 mL

## 2024-01-09 MED ORDER — PROPOFOL 10 MG/ML IV BOLUS
INTRAVENOUS | Status: AC
  Filled 2024-01-09: qty 20

## 2024-01-09 MED ORDER — MIDAZOLAM HCL 2 MG/2ML IJ SOLN
INTRAMUSCULAR | Status: DC | PRN
Start: 2024-01-09 — End: 2024-01-09
  Administered 2024-01-09: 2 mg via INTRAVENOUS

## 2024-01-09 MED ORDER — FENTANYL CITRATE PF 50 MCG/ML IJ SOSY
50.0000 ug | PREFILLED_SYRINGE | Freq: Once | INTRAMUSCULAR | Status: AC
  Administered 2024-01-09: 50 ug via INTRAVENOUS
  Filled 2024-01-09: qty 1

## 2024-01-09 SURGICAL SUPPLY — 40 items
BAG COUNTER SPONGE SURGICOUNT (BAG) ×4 IMPLANT
BAG DECANTER FOR FLEXI CONT (MISCELLANEOUS) ×3 IMPLANT
BAG DRAIN URO-CYSTO SKYTR STRL (DRAIN) ×3 IMPLANT
BLADE SURG 15 STRL LF DISP TIS (BLADE) ×4 IMPLANT
BNDG GAUZE DERMACEA FLUFF 4 (GAUZE/BANDAGES/DRESSINGS) ×1 IMPLANT
BRIEF MESH DISP LRG (UNDERPADS AND DIAPERS) ×4 IMPLANT
COVER SURGICAL LIGHT HANDLE (MISCELLANEOUS) ×7 IMPLANT
DERMABOND ADVANCED .7 DNX12 (GAUZE/BANDAGES/DRESSINGS) ×1 IMPLANT
DRAPE HALF SHEET 40X57 (DRAPES) IMPLANT
DRAPE LAPAROTOMY T 102X78X121 (DRAPES) ×1 IMPLANT
ELECT CAUTERY BLADE 6.4 (BLADE) ×1 IMPLANT
ELECT NDL BLADE 2-5/6 (NEEDLE) IMPLANT
ELECT NEEDLE BLADE 2-5/6 (NEEDLE) ×4 IMPLANT
ELECTRODE REM PT RTRN 9FT ADLT (ELECTROSURGICAL) ×4 IMPLANT
GAUZE 4X4 16PLY ~~LOC~~+RFID DBL (SPONGE) ×4 IMPLANT
GLOVE BIOGEL M STRL SZ7.5 (GLOVE) ×3 IMPLANT
GOWN STRL REUS W/ TWL LRG LVL3 (GOWN DISPOSABLE) ×9 IMPLANT
KIT BASIN OR (CUSTOM PROCEDURE TRAY) ×4 IMPLANT
KIT TURNOVER KIT B (KITS) ×4 IMPLANT
LEGGING LITHOTOMY PAIR STRL (DRAPES) IMPLANT
NDL HYPO 25X1 1.5 SAFETY (NEEDLE) IMPLANT
NEEDLE HYPO 25X1 1.5 SAFETY (NEEDLE) ×4 IMPLANT
NS IRRIG 1000ML POUR BTL (IV SOLUTION) ×4 IMPLANT
PACK SRG BSC III STRL LF ECLPS (CUSTOM PROCEDURE TRAY) ×4 IMPLANT
PAD ARMBOARD POSITIONER FOAM (MISCELLANEOUS) ×8 IMPLANT
PENCIL BUTTON HOLSTER BLD 10FT (ELECTRODE) ×4 IMPLANT
SET HNDPC FAN SPRY TIP SCT (DISPOSABLE) IMPLANT
SPONGE T-LAP 4X18 ~~LOC~~+RFID (SPONGE) IMPLANT
SUPPORTER AHLETIC TETRA LG (SOFTGOODS) ×1 IMPLANT
SUT CHROMIC 3 0 PS 2 (SUTURE) IMPLANT
SUT CHROMIC 3 0 SH 27 (SUTURE) ×1 IMPLANT
SUT CHROMIC 4 0 RB 1X27 (SUTURE) IMPLANT
SUT PROLENE 4-0 RB1 .5 CRCL 36 (SUTURE) IMPLANT
SUT VIC AB 3-0 SH 27X BRD (SUTURE) ×1 IMPLANT
SUT VIC AB 4-0 PS2 18 (SUTURE) ×2 IMPLANT
SYR BULB IRRIG 60ML STRL (SYRINGE) ×1 IMPLANT
SYR CONTROL 10ML LL (SYRINGE) ×4 IMPLANT
TUBE CONNECTING 12X1/4 (SUCTIONS) ×4 IMPLANT
WATER STERILE IRR 1000ML POUR (IV SOLUTION) ×4 IMPLANT
YANKAUER SUCT BULB TIP NO VENT (SUCTIONS) ×4 IMPLANT

## 2024-01-09 NOTE — Op Note (Addendum)
 Preoperative diagnosis:   1. left testicular torsion  Postoperative diagnosis: 1. left testicular torsion  Procedure(s): 1. Scrotal exploration 2. Bilateral orchiopexy   Surgeon: Dr. Valli Shank, MD  Assistant: Lyle GEANNIE Civil, MD   Anesthesia: General  Complications: None  EBL: Minimal  Specimens: None  Intraoperative findings:  - Left testicle with 180 degree torsion, quickly pinked back up after detorsion - Bilateral testicular orchiopexy performed   Indication:  67M who presented with left testicular pain.  Exam findings and ultrasound findings were consistent with testicular torsion. After discussing these findings, I recommended the above procedures.  I discussed the potential benefits and risks of the procedure, side effects of the proposed treatment, the likelihood of the patient achieving the goals of the procedure, and any potential problems that might occur during the procedure or recuperation. We specifically discussed the potential loss of his testicle and the possibility of further infertility or testosterone deficiency. He gave informed consent to proceed.  Description of procedure:  The patient was taken to the operating room, placed supine, given general anesthesia, and prepped and draped in the usual sterile fashion.  He was given preoperative antibiotics and a preoperative time out was performed.  A midline scrotal incision was made and carried down through the dartos layer over the left testis.  The testis was delivered and was noted to be slightly purple.  The testis was detorsed and quickly revascularized. We wrapped the testicle in a saline-soaked gauze and turned our attention the right side.   We delivered the right testicle and performed an orchiopexy with 4-0 Vicryl sutures placed inferiorly, medially, and laterally in the dartos tissue and placed into their corresponding areas of the tunica albuginea of the scrotum.  These were then each tied thereby  fixating the testis at 3 points preventing rotation. The testis was easily placed in the hemiscrotum  We then turned our attention back to the left side and again noted the testicle looked improved. We similarly preformed a 3-point orchiopexy. The testis was easily placed in the hemiscrotum and the overlying dartos tissue was closed with a running 3-0 Vicryl suture. The skin was closed in a horizontal mattress running with 3-0 Chromic and Dermabond.  A scrotal support was applied with a fluff dressing.  The patient tolerated the procedure well and without complications.  He was able to be extubated and transferred to the PACU in stable condition.

## 2024-01-09 NOTE — ED Provider Triage Note (Signed)
 Emergency Medicine Provider Triage Evaluation Note  Emerson Schreifels , a 23 y.o. male  was evaluated in triage.  Pt complains of left-sided testicle pain.  Patient reports he woke up about 45 minutes ago with left-sided testicle pain.  Denies concern of STI.  Denies dysuria.  Reports pain radiates into abdomen and he is complaining of nausea without vomiting.  Denies diarrhea or fevers.  Reports this began about 45 minutes ago at 4:35 AM.  Recommended she had it.  Labs ordered, testicular torsion rule out ultrasound ordered.  Review of Systems  Positive:  Negative:   Physical Exam  BP (!) 144/118 (BP Location: Left Arm)   Pulse 74   Resp 20   SpO2 100%  Gen:   Awake, no distress   Resp:  Normal effort  MSK:   Moves extremities without difficulty  Other:  No obvious testicular swelling on exam.  Left testicle tender to touch.  Medical Decision Making  Medically screening exam initiated at 5:20 AM.  Appropriate orders placed.  Rector Devonshire was informed that the remainder of the evaluation will be completed by another provider, this initial triage assessment does not replace that evaluation, and the importance of remaining in the ED until their evaluation is complete.     Ruthell Lonni FALCON, PA-C 01/09/24 [phone]

## 2024-01-09 NOTE — Anesthesia Procedure Notes (Signed)
 Procedure Name: Intubation Date/Time: 01/09/2024 7:30 AM  Performed by: Boyce Shilling, CRNAPre-anesthesia Checklist: Patient identified, Emergency Drugs available, Suction available, Timeout performed and Patient being monitored Patient Re-evaluated:Patient Re-evaluated prior to induction Oxygen Delivery Method: Circle system utilized Preoxygenation: Pre-oxygenation with 100% oxygen Induction Type: IV induction, Rapid sequence and Cricoid Pressure applied Laryngoscope Size: Mac and 4 Grade View: Grade I Tube type: Oral Tube size: 7.5 mm Number of attempts: 1 Airway Equipment and Method: Stylet Placement Confirmation: ETT inserted through vocal cords under direct vision, positive ETCO2, CO2 detector and breath sounds checked- equal and bilateral Secured at: 23 cm Tube secured with: Tape Dental Injury: Teeth and Oropharynx as per pre-operative assessment

## 2024-01-09 NOTE — Discharge Instructions (Signed)
 Scrotal surgery postoperative instructions  Wound:  In most cases your incision will have absorbable sutures that will dissolve within the first 10-20 days. Some will fall out even earlier. Expect some redness as the sutures dissolved but this should occur only around the sutures. If there is generalized redness, especially with increasing pain or swelling, let us  know. The scrotum will very likely get black and blue as the blood in the tissues spread. Sometimes the whole scrotum will turn colors. The black and blue is followed by a yellow and brown color. In time, all the discoloration will go away. In some cases some firm swelling in the area of the testicle may persist for up to 4-6 weeks after the surgery and is considered normal in most cases.  Diet:  You may return to your normal diet within 24 hours following your surgery. You may note some mild nausea and possibly vomiting the first 6-8 hours following surgery. This is usually due to the side effects of anesthesia, and will disappear quite soon. I would suggest clear liquids and a very light meal the first evening following your surgery.  Activity:  Your physical activity should be restricted the first 48 hours. During that time you should remain relatively inactive, moving about only when necessary. During the first 7-10 days following surgery he should avoid lifting any heavy objects (anything greater than 15 pounds), and avoid strenuous exercise. If you work, ask us  specifically about your restrictions, both for work and home. We will write a note to your employer if needed.  You should plan to wear a tight pair of jockey shorts or an athletic supporter for the first 4-5 days, even to sleep. This will keep the scrotum immobilized to some degree and keep the swelling down.  Ice packs should be placed on and off over the scrotum for the first 48 hours. Frozen peas or corn in a ZipLock bag can be frozen, used and re-frozen. Fifteen minutes  on and 15 minutes off is a reasonable schedule. The ice is a good pain reliever and keeps the swelling down.  Hygiene:  You may shower 48 hours after your surgery. Tub bathing should be restricted until the seventh day.  Medication:  You will be sent home with some type of pain medication. In many cases you will be sent home with a narcotic pain pill. If the pain is not too bad, you may take either Tylenol  (acetaminophen ) or Advil  (ibuprofen ) which contain no narcotic agents, and might be tolerated a little better, with fewer side effects. If the pain medication you are sent home with does not control the pain, you will have to let us  know. Some narcotic pain medications cannot be given or refilled by a phone call to a pharmacy.  Problems you should report to us :  Fever of 101.0 degrees Fahrenheit or greater. Moderate or severe swelling under the skin incision or involving the scrotum. Drug reaction such as hives, a rash, nausea or vomiting.

## 2024-01-09 NOTE — Anesthesia Postprocedure Evaluation (Signed)
 Anesthesia Post Note  Patient: Bruce Hickman  Procedure(s) Performed: EXPLORATION, SCROTUM (Scrotum) ORCHIOPEXY ADULT (Left: Scrotum) ORCHIOPEXY ADULT (Right: Scrotum)     Patient location during evaluation: PACU Anesthesia Type: General Level of consciousness: awake and alert Pain management: pain level controlled Vital Signs Assessment: post-procedure vital signs reviewed and stable Respiratory status: spontaneous breathing, nonlabored ventilation and respiratory function stable Cardiovascular status: stable and blood pressure returned to baseline Anesthetic complications: no   No notable events documented.  Last Vitals:  Vitals:   01/09/24 0915 01/09/24 0930  BP: 102/66 113/67  Pulse: 86 94  Resp: 11 15  Temp:  (!) 36.3 C  SpO2: 92% 94%    Last Pain:  Vitals:   01/09/24 0900  PainSc: Asleep                 Debby FORBES Like

## 2024-01-09 NOTE — Consult Note (Signed)
 Urology Consult   Physician requesting consult: Bruce Hickman ED  Reason for consult: testicular pain   History of Present Illness: Bruce Hickman is a 23 y.o. who presents with left testicular pain that started this morning.   He denies a history of voiding or storage urinary symptoms, hematuria, UTIs, STDs, urolithiasis, GU malignancy/trauma/surgery.  History reviewed. No pertinent past medical history.  History reviewed. No pertinent surgical history.  Current Hospital Medications:  Home Meds:  No current facility-administered medications on file prior to encounter.   Current Outpatient Medications on File Prior to Encounter  Medication Sig Dispense Refill   albuterol  (VENTOLIN  HFA) 108 (90 Base) MCG/ACT inhaler Inhale 1-2 puffs into the lungs every 6 (six) hours as needed for wheezing or shortness of breath. 6.7 g 3   cetirizine  (ZYRTEC  ALLERGY) 10 MG tablet Take 1 tablet (10 mg total) by mouth daily. 30 tablet 1     Scheduled Meds: Continuous Infusions: PRN Meds:.  Allergies: No Known Allergies  Family History  Problem Relation Age of Onset   High blood pressure Father    Other Father        R hand surgery   Sleep apnea Father    Diabetes Paternal Grandmother    High blood pressure Paternal Grandmother    Other Paternal Grandfather        prediabetes    Social History:  reports that he has never smoked. He has been exposed to tobacco smoke. He has never used smokeless tobacco. He reports current alcohol use. He reports current drug use. Drug: Marijuana.  ROS: A complete review of systems was performed.  All systems are negative except for pertinent findings as noted.  Physical Exam:  Vital signs in last 24 hours: Pulse Rate:  [74] 74 (09/18 0515) Resp:  [20] 20 (09/18 0515) BP: (144)/(118) 144/118 (09/18 0515) SpO2:  [100 %] 100 % (09/18 0515) Constitutional:  Alert and oriented, acute distress Cardiovascular: Regular rate  Respiratory: Normal respiratory  effort GI: Abdomen is soft, nontender, nondistended.  GU: Left testicle with abnormal high lie, tender to palpation and slightly enlarged compared to right. No TTP on right side, normal lying on right.  Neurologic: Grossly intact, no focal deficits Psychiatric: Normal mood and affect  Laboratory Data:  Recent Labs    01/09/24 0531  WBC 9.1  HGB 16.4  HCT 49.1  PLT 270    No results for input(s): NA, K, CL, GLUCOSE, BUN, CALCIUM, CREATININE in the last 72 hours.  Invalid input(s): CO3   Results for orders placed or performed during the hospital encounter of 01/09/24 (from the past 24 hours)  CBC     Status: None   Collection Time: 01/09/24  5:31 AM  Result Value Ref Range   WBC 9.1 4.0 - 10.5 K/uL   RBC 5.71 4.22 - 5.81 MIL/uL   Hemoglobin 16.4 13.0 - 17.0 g/dL   HCT 50.8 60.9 - 47.9 %   MCV 86.0 80.0 - 100.0 fL   MCH 28.7 26.0 - 34.0 pg   MCHC 33.4 30.0 - 36.0 g/dL   RDW 85.6 88.4 - 84.4 %   Platelets 270 150 - 400 K/uL   nRBC 0.0 0.0 - 0.2 %   No results found for this or any previous visit (from the past 240 hours).  Renal Function: No results for input(s): CREATININE in the last 168 hours. CrCl cannot be calculated (Patient's most recent lab result is older than the maximum 21 days allowed.).  Radiologic Imaging:  No results found.  I independently reviewed the above imaging studies.  Impression/Recommendation 53M with R testicular pain. Scrotal US  obtained and demonstrates no flow on lest side.   Plan to proceed with emergent scrotal exploration with bilateral orchiopexy, possible left orchiectomy. Will plan for likely discharge postoperatively.   Bruce Hickman N Bruce Hickman 01/09/2024, 6:11 AM

## 2024-01-09 NOTE — ED Triage Notes (Signed)
 Pt woke up with testicular pain that he describes as if he had gotten  kick in the balls  states that its localized to only the right testicle. Pt can't sit down due to the pain as well.

## 2024-01-09 NOTE — ED Provider Notes (Signed)
 [facility] Emergency Department Provider Note MRN:  [id number]  Arrival date & time: 01/09/24     Chief Complaint   Testicle Pain   History of Present Illness   Bruce Hickman is a 23 y.o. year-old male with no pertinent past medical history presenting to the ED with chief complaint of testicle pain.  Acute left testicular pain waking him from sleep  Review of Systems  A thorough review of systems was obtained and all systems are negative except as noted in the HPI and PMH.   Patient's Health History   History reviewed. No pertinent past medical history.  History reviewed. No pertinent surgical history.  Family History  Problem Relation Age of Onset   High blood pressure Father    Other Father        R hand surgery   Sleep apnea Father    Diabetes Paternal Grandmother    High blood pressure Paternal Grandmother    Other Paternal Grandfather        prediabetes    Social History   Socioeconomic History   Marital status: Single    Spouse name: Not on file   Number of children: Not on file   Years of education: Not on file   Highest education level: Not on file  Occupational History   Not on file  Tobacco Use   Smoking status: Never    Passive exposure: Yes   Smokeless tobacco: Never  Vaping Use   Vaping status: Never Used  Substance and Sexual Activity   Alcohol use: Yes    Comment: occ   Drug use: Yes    Types: Marijuana   Sexual activity: Not on file  Other Topics Concern   Not on file  Social History Narrative   Lives at home with Dad    Social Drivers of Health   Financial Resource Strain: Not on file  Food Insecurity: Not on file  Transportation Needs: Not on file  Physical Activity: Not on file  Stress: Not on file  Social Connections: Not on file  Intimate Partner Violence: Not on file     Physical Exam   Vitals:   01/09/24 0515  BP: (!) 144/118  Pulse: 74  Resp: 20  SpO2: 100%    CONSTITUTIONAL:  Well-appearing, moderate distress due to pain NEURO/PSYCH:  Alert and oriented x 3, no focal deficits EYES:  eyes equal and reactive ENT/NECK:  no LAD, no JVD CARDIO: Regular rate, well-perfused, normal S1 and S2 PULM:  CTAB no wheezing or rhonchi GI/GU:  non-distended, non-tender MSK/SPINE:  No gross deformities, no edema SKIN:  no rash, atraumatic   *Additional and/or pertinent findings included in MDM below  Diagnostic and Interventional Summary    EKG Interpretation Date/Time:    Ventricular Rate:    PR Interval:    QRS Duration:    QT Interval:    QTC Calculation:   R Axis:      Text Interpretation:         Labs Reviewed  COMPREHENSIVE METABOLIC PANEL WITH GFR - Abnormal; Notable for the following components:      Result Value   Chloride 97 (*)    Glucose, Bld 143 (*)    Total Bilirubin 1.7 (*)    Anion gap 16 (*)    All other components within normal limits  CBC  URINALYSIS, ROUTINE W REFLEX MICROSCOPIC  GC/CHLAMYDIA PROBE AMP ([place]) NOT AT [facility]    US  SCROTUM W/DOPPLER  Final Result  Medications  fentaNYL  (SUBLIMAZE ) injection 50 mcg (50 mcg Intravenous Given 01/09/24 0537)  ondansetron  (ZOFRAN ) injection 4 mg (4 mg Intravenous Given 01/09/24 0537)  HYDROmorphone  (DILAUDID ) injection 1 mg (1 mg Intravenous Given 01/09/24 0545)     Procedures  /  Critical Care .Critical Care  Performed by: Theadore Ozell HERO, MD Authorized by: Theadore Ozell HERO, MD   Critical care provider statement:    Critical care time (minutes):  45   Critical care was necessary to treat or prevent imminent or life-threatening deterioration of the following conditions: Testicular torsion.   Critical care was time spent personally by me on the following activities:  Development of treatment plan with patient or surrogate, discussions with consultants, evaluation of patient's response to treatment, examination of patient, ordering and review of laboratory studies, ordering and  review of radiographic studies, ordering and performing treatments and interventions, pulse oximetry, re-evaluation of patient's condition and review of old charts   ED Course and Medical Decision Making  Initial Impression and Ddx Patient writhing in pain, left testicle has abnormal lie, elevated, horizontal, the left testicle feels denser and larger on the left.  I attempted open book reduction without any relief of discomfort.  After 10 minutes I attempted closed book reduction again without any improvement.  While awaiting ultrasound I called Dr. Elby of urology given the concern for torsion.  She is on the way.  Past medical/surgical history that increases complexity of ED encounter: None  Interpretation of Diagnostics I personally reviewed the Laboratory Testing and my interpretation is as follows: No significant blood count or electrolyte disturbance.  Ultrasound confirms testicular torsion.  Patient Reassessment and Ultimate Disposition/Management     Plan is for OR intervention.  Patient management required discussion with the following services or consulting groups:  Urology  Complexity of Problems Addressed Acute illness or injury that poses threat of life of bodily function  Additional Data Reviewed and Analyzed Further history obtained from: Further history from spouse/family member  Additional Factors Impacting ED Encounter Risk Use of parenteral controlled substances and Consideration of hospitalization  Ozell HERO. Theadore, MD [facility] Emergency Medicine [facility] [email or web address]  Final Clinical Impressions(s) / ED Diagnoses     ICD-10-CM   1. Testicular torsion  N44.00       ED Discharge Orders     None        Discharge Instructions Discussed with and Provided to Patient:   Discharge Instructions   None      Theadore Ozell HERO, MD 01/09/24 [phone]

## 2024-01-09 NOTE — Transfer of Care (Signed)
 Immediate Anesthesia Transfer of Care Note  Patient: Bruce Hickman  Procedure(s) Performed: EXPLORATION, SCROTUM (Scrotum) ORCHIOPEXY ADULT (Left: Scrotum) ORCHIOPEXY ADULT (Right: Scrotum)  Patient Location: PACU  Anesthesia Type:General  Level of Consciousness: awake and drowsy  Airway & Oxygen Therapy: Patient Spontanous Breathing and Patient connected to face mask oxygen  Post-op Assessment: Report given to RN and Post -op Vital signs reviewed and stable  Post vital signs: Reviewed and stable  Last Vitals:  Vitals Value Taken Time  BP 93/50 01/09/24 08:30  Temp    Pulse 104 01/09/24 08:37  Resp 16 01/09/24 08:37  SpO2 94 % 01/09/24 08:37  Vitals shown include unfiled device data.  Last Pain:  Vitals:   01/09/24 0713  PainSc: 6          Complications: No notable events documented.

## 2024-01-09 NOTE — ED Notes (Signed)
 Patient transported to Ultrasound

## 2024-01-09 NOTE — Anesthesia Preprocedure Evaluation (Addendum)
 Anesthesia Evaluation  Patient identified by MRN, date of birth, ID band Patient awake    Reviewed: Allergy & Precautions, NPO status , Patient's Chart, lab work & pertinent test results  History of Anesthesia Complications Negative for: history of anesthetic complications  Airway Mallampati: I  TM Distance: >3 FB Neck ROM: Full    Dental  (+) Dental Advisory Given, Chipped   Pulmonary Current Smoker and Patient abstained from smoking.   Pulmonary exam normal        Cardiovascular negative cardio ROS Normal cardiovascular exam     Neuro/Psych negative neurological ROS  negative psych ROS   GI/Hepatic negative GI ROS,,,(+)     substance abuse  marijuana use  Endo/Other  negative endocrine ROS    Renal/GU negative Renal ROS     Musculoskeletal negative musculoskeletal ROS (+)    Abdominal   Peds  Hematology negative hematology ROS (+)   Anesthesia Other Findings Testicular torsion  Reproductive/Obstetrics                              Anesthesia Physical Anesthesia Plan  ASA: 2 and emergent  Anesthesia Plan: General   Post-op Pain Management: Ofirmev  IV (intra-op)* and Toradol  IV (intra-op)*   Induction: Intravenous and Rapid sequence  PONV Risk Score and Plan: 2 and Treatment may vary due to age or medical condition, Ondansetron , Dexamethasone  and Midazolam   Airway Management Planned: Oral ETT  Additional Equipment: None  Intra-op Plan:   Post-operative Plan: Extubation in OR  Informed Consent: I have reviewed the patients History and Physical, chart, labs and discussed the procedure including the risks, benefits and alternatives for the proposed anesthesia with the patient or authorized representative who has indicated his/her understanding and acceptance.     Dental advisory given  Plan Discussed with: CRNA and Anesthesiologist  Anesthesia Plan Comments:           Anesthesia Quick Evaluation

## 2024-01-10 ENCOUNTER — Encounter (HOSPITAL_COMMUNITY): Payer: Self-pay | Admitting: Urology

## 2024-03-25 ENCOUNTER — Ambulatory Visit
Admission: RE | Admit: 2024-03-25 | Discharge: 2024-03-25 | Disposition: A | Discharge status: Home / Self Care | Source: Ambulatory Visit | Attending: Family Medicine | Admitting: Family Medicine

## 2024-03-25 VITALS — BP 106/85 | HR 68 | Temp 98.9°F | Resp 17

## 2024-03-25 DIAGNOSIS — R82998 Other abnormal findings in urine: Secondary | ICD-10-CM | POA: Insufficient documentation

## 2024-03-25 DIAGNOSIS — Z113 Encounter for screening for infections with a predominantly sexual mode of transmission: Secondary | ICD-10-CM | POA: Insufficient documentation

## 2024-03-25 DIAGNOSIS — Z202 Contact with and (suspected) exposure to infections with a predominantly sexual mode of transmission: Secondary | ICD-10-CM | POA: Insufficient documentation

## 2024-03-25 DIAGNOSIS — R300 Dysuria: Secondary | ICD-10-CM | POA: Diagnosis present

## 2024-03-25 LAB — POCT URINE DIPSTICK
Bilirubin, UA: NEGATIVE
Blood, UA: NEGATIVE
Glucose, UA: NEGATIVE mg/dL
Ketones, POC UA: NEGATIVE mg/dL
Nitrite, UA: NEGATIVE
POC PROTEIN,UA: 30 — AB
Spec Grav, UA: 1.02 (ref 1.010–1.025)
Urobilinogen, UA: 0.2 U/dL
pH, UA: 6.5 (ref 5.0–8.0)

## 2024-03-25 MED ORDER — DOXYCYCLINE HYCLATE 100 MG PO CAPS: 100.0000 mg | ORAL_CAPSULE | Freq: Two times a day (BID) | ORAL | 0 refills | Status: AC

## 2024-03-25 NOTE — ED Triage Notes (Addendum)
 Pt present with intermittent discomfort when voiding x 2 weeks. States he has not been sexually active x 3 weeks. Pt reports he voids more often than other days.   Pt is requesting STD testing.  Pt is requesting blood work

## 2024-03-25 NOTE — Discharge Instructions (Addendum)
 The clinic will contact you with results of the urine culture and STD testing done today if positive.  Start doxycycline twice daily for 7 days.  Avoid sexual course for 7 days while you are in treatment as well as for 7 days once you complete treatment.  Follow-up with your PCP if your symptoms do not improve.  Please go to the ER for any worsening symptoms.  Hope you feel better soon!

## 2024-03-25 NOTE — ED Provider Notes (Signed)
 [facility]    CSN: [id number] Arrival date & time: 03/25/24  1426      History   Chief Complaint Chief Complaint  Patient presents with   SEXUALLY TRANSMITTED DISEASE    HPI Bruce Hickman is a 23 y.o. male presents for dysuria/STD testing.  Patient reports intermittent dysuria x 2 weeks.  He states he had 1 day of some penile discharge but denies any testicular pain or swelling, fevers, vomiting or flank pain.  Believes he was exposed to chlamydia.  No other concerns at this time.  HPI  Past Medical History:  Diagnosis Date   Medical history non-contributory     Patient Active Problem List   Diagnosis Date Noted   Poor sleep hygiene 05/23/2017   Well child check 12/01/2013    Past Surgical History:  Procedure Laterality Date   ORCHIECTOMY Left 01/09/2024   Procedure: ORCHIOPEXY ADULT;  Surgeon: Elisabeth Valli BIRCH, MD;  Location: [facility] OR;  Service: Urology;  Laterality: Left;   ORCHIOPEXY Right 01/09/2024   Procedure: ORCHIOPEXY ADULT;  Surgeon: Elisabeth Valli BIRCH, MD;  Location: [facility] OR;  Service: Urology;  Laterality: Right;   SCROTAL EXPLORATION N/A 01/09/2024   Procedure: EXPLORATION, SCROTUM;  Surgeon: Elisabeth Valli BIRCH, MD;  Location: [facility] OR;  Service: Urology;  Laterality: N/A;       Home Medications    Prior to Admission medications   Medication Sig Start Date End Date Taking? Authorizing Provider  doxycycline (VIBRAMYCIN) 100 MG capsule Take 1 capsule (100 mg total) by mouth 2 (two) times daily for 7 days. 03/25/24 04/01/24 Yes Marilu Rylander, Jodi R, NP  acetaminophen  (TYLENOL ) 500 MG tablet Take 2 tablets (1,000 mg total) by mouth every 6 (six) hours as needed. 01/09/24 01/08/25  Spratte, Lyle SAILOR, MD  albuterol  (VENTOLIN  HFA) 108 (90 Base) MCG/ACT inhaler Inhale 1-2 puffs into the lungs every 6 (six) hours as needed for wheezing or shortness of breath. 10/09/23   White, Elizabeth A, PA-C  cetirizine  (ZYRTEC  ALLERGY) 10 MG tablet Take 1 tablet (10 mg total) by mouth  daily. 10/09/23   White, Elizabeth A, PA-C  oxyCODONE  (ROXICODONE ) 5 MG immediate release tablet Take 1 tablet (5 mg total) by mouth every 8 (eight) hours as needed. 01/09/24 01/08/25  Elisabeth Valli BIRCH, MD    Family History Family History  Problem Relation Age of Onset   High blood pressure Father    Other Father        R hand surgery   Sleep apnea Father    Diabetes Paternal Grandmother    High blood pressure Paternal Grandmother    Other Paternal Grandfather        prediabetes    Social History Social History   Tobacco Use   Smoking status: Never    Passive exposure: Yes   Smokeless tobacco: Never  Vaping Use   Vaping status: Never Used  Substance Use Topics   Alcohol use: Yes    Comment: occ   Drug use: Yes    Types: Marijuana     Allergies   Patient has no known allergies.   Review of Systems Review of Systems  Genitourinary:  Positive for dysuria and penile discharge.     Physical Exam Triage Vital Signs ED Triage Vitals  Encounter Vitals Group     BP 03/25/24 1512 106/85     Girls Systolic BP Percentile --      Girls Diastolic BP Percentile --      Boys Systolic BP Percentile --  Boys Diastolic BP Percentile --      Pulse Rate 03/25/24 1510 68     Resp 03/25/24 1510 17     Temp 03/25/24 1510 98.9 F (37.2 C)     Temp Source 03/25/24 1510 Oral     SpO2 03/25/24 1510 95 %     Weight --      Height --      Head Circumference --      Peak Flow --      Pain Score 03/25/24 1510 0     Pain Loc --      Pain Education --      Exclude from Growth Chart --    No data found.  Updated Vital Signs BP 106/85   Pulse 68   Temp 98.9 F (37.2 C) (Oral)   Resp 17   SpO2 95%   Visual Acuity Right Eye Distance:   Left Eye Distance:   Bilateral Distance:    Right Eye Near:   Left Eye Near:    Bilateral Near:     Physical Exam Vitals and nursing note reviewed.  Constitutional:      Appearance: Normal appearance.  HENT:     Head:  Normocephalic and atraumatic.  Eyes:     Pupils: Pupils are equal, round, and reactive to light.  Cardiovascular:     Rate and Rhythm: Normal rate.  Pulmonary:     Effort: Pulmonary effort is normal.  Skin:    General: Skin is warm and dry.  Neurological:     General: No focal deficit present.     Mental Status: He is alert and oriented to person, place, and time.  Psychiatric:        Mood and Affect: Mood normal.        Behavior: Behavior normal.      UC Treatments / Results  Labs (all labs ordered are listed, but only abnormal results are displayed) Labs Reviewed  POCT URINE DIPSTICK - Abnormal; Notable for the following components:      Result Value   POC PROTEIN,UA =30 (*)    Leukocytes, UA Trace (*)    All other components within normal limits  URINE CULTURE  SYPHILIS: RPR W/REFLEX TO RPR TITER AND TREPONEMAL ANTIBODIES, TRADITIONAL SCREENING AND DIAGNOSIS ALGORITHM  HIV ANTIBODY (ROUTINE TESTING W REFLEX)  CYTOLOGY, (ORAL, ANAL, URETHRAL) ANCILLARY ONLY    EKG   Radiology No results found.  Procedures Procedures (including critical care time)  Medications Ordered in UC Medications - No data to display  Initial Impression / Assessment and Plan / UC Course  I have reviewed the triage vital signs and the nursing notes.  Pertinent labs & imaging results that were available during my care of the patient were reviewed by me and considered in my medical decision making (see chart for details).     Urine with trace leuks, will send urine culture and contact if positive.  STD testing is ordered and will also contact if positive.  Given exposure to chlamydia we will treat with doxycycline twice daily for 7 days.  Instructed no sexual intercourse for 14 days.  PCP follow-up if symptoms do not improve.  ER precautions reviewed. Final Clinical Impressions(s) / UC Diagnoses   Final diagnoses:  Dysuria  Screening examination for STD (sexually transmitted disease)   Leukocytes in urine  Exposure to chlamydia     Discharge Instructions      The clinic will contact you with results of the urine culture  and STD testing done today if positive.  Start doxycycline twice daily for 7 days.  Avoid sexual course for 7 days while you are in treatment as well as for 7 days once you complete treatment.  Follow-up with your PCP if your symptoms do not improve.  Please go to the ER for any worsening symptoms.  Hope you feel better soon!    ED Prescriptions     Medication Sig Dispense Auth. Provider   doxycycline (VIBRAMYCIN) 100 MG capsule Take 1 capsule (100 mg total) by mouth 2 (two) times daily for 7 days. 14 capsule Toma Arts, Jodi R, NP      PDMP not reviewed this encounter.   Loreda Myla SAUNDERS, NP 03/25/24 [phone]

## 2024-03-26 LAB — CYTOLOGY, (ORAL, ANAL, URETHRAL) ANCILLARY ONLY
Chlamydia: NEGATIVE
Comment: NEGATIVE
Comment: NEGATIVE
Comment: NORMAL
Neisseria Gonorrhea: NEGATIVE
Trichomonas: NEGATIVE

## 2024-03-26 LAB — SYPHILIS: RPR W/REFLEX TO RPR TITER AND TREPONEMAL ANTIBODIES, TRADITIONAL SCREENING AND DIAGNOSIS ALGORITHM: RPR Ser Ql: NONREACTIVE

## 2024-03-26 LAB — HIV ANTIBODY (ROUTINE TESTING W REFLEX): HIV Screen 4th Generation wRfx: NONREACTIVE

## 2024-03-27 ENCOUNTER — Ambulatory Visit (HOSPITAL_COMMUNITY): Payer: Self-pay

## 2024-03-27 LAB — URINE CULTURE: Culture: NO GROWTH

## 2024-03-31 ENCOUNTER — Ambulatory Visit
Admission: RE | Admit: 2024-03-31 | Discharge: 2024-03-31 | Disposition: A | Discharge status: Home / Self Care | Source: Ambulatory Visit | Attending: Family Medicine

## 2024-03-31 VITALS — BP 111/74 | HR 84 | Temp 98.7°F | Resp 20

## 2024-03-31 DIAGNOSIS — Z113 Encounter for screening for infections with a predominantly sexual mode of transmission: Secondary | ICD-10-CM

## 2024-03-31 DIAGNOSIS — R300 Dysuria: Secondary | ICD-10-CM

## 2024-03-31 LAB — POCT URINE DIPSTICK
Bilirubin, UA: NEGATIVE
Blood, UA: NEGATIVE
Glucose, UA: NEGATIVE mg/dL
Nitrite, UA: NEGATIVE
POC PROTEIN,UA: 100 — AB
Spec Grav, UA: >=1.03 — AB (ref 1.010–1.025)
Urobilinogen, UA: 0.2 U/dL
pH, UA: 5.5 (ref 5.0–8.0)

## 2024-03-31 NOTE — ED Triage Notes (Signed)
 Pt present for STD testing. Pt states he is experiencing a burning sensation when voiding. When drinking water the burning goes away.

## 2024-03-31 NOTE — Discharge Instructions (Addendum)
 The clinic will contact you with results of the testing done today if positive.  Increase your fluid intake as this seems to improve your symptoms.  Please follow-up with your PCP at your scheduled appointment for further evaluation and treatment.  Please go to the ER for any worsening symptoms.  Hope you feel better soon!

## 2024-03-31 NOTE — ED Provider Notes (Signed)
 [facility]    CSN: [id number] Arrival date & time: 03/31/24  1625      History   Chief Complaint Chief Complaint  Patient presents with   Urinary Frequency    I'm still having problems from the last time even though my results came back negative and just need to know what is going on or what do i need to do to fix this problem before it gets worse. - Entered by patient    HPI Bruce Hickman is a 23 y.o. male presents for dysuria.  Patient reports 2 weeks of burning with urination.  He denies any penile discharge, testicular pain or swelling, rashes, fevers chills or back pain.  No STD exposure.  Symptoms do resolve when he drinks more water.  He was seen in urgent care on 12/3 for same symptoms. He was treated with doxycycline , he had negative STD testing and urine culture was negative.  He is not sure if his symptoms got better with this treatment.  No other concerns at this time.  Urinary Frequency    Past Medical History:  Diagnosis Date   Medical history non-contributory     Patient Active Problem List   Diagnosis Date Noted   Poor sleep hygiene 05/23/2017   Well child check 12/01/2013    Past Surgical History:  Procedure Laterality Date   ORCHIECTOMY Left 01/09/2024   Procedure: ORCHIOPEXY ADULT;  Surgeon: Elisabeth Valli BIRCH, MD;  Location: [facility] OR;  Service: Urology;  Laterality: Left;   ORCHIOPEXY Right 01/09/2024   Procedure: ORCHIOPEXY ADULT;  Surgeon: Elisabeth Valli BIRCH, MD;  Location: [facility] OR;  Service: Urology;  Laterality: Right;   SCROTAL EXPLORATION N/A 01/09/2024   Procedure: EXPLORATION, SCROTUM;  Surgeon: Elisabeth Valli BIRCH, MD;  Location: [facility] OR;  Service: Urology;  Laterality: N/A;       Home Medications    Prior to Admission medications   Medication Sig Start Date End Date Taking? Authorizing Provider  acetaminophen  (TYLENOL ) 500 MG tablet Take 2 tablets (1,000 mg total) by mouth every 6 (six) hours as needed. 01/09/24 01/08/25  Spratte, Lyle SAILOR, MD  albuterol  (VENTOLIN  HFA) 108 (90 Base) MCG/ACT inhaler Inhale 1-2 puffs into the lungs every 6 (six) hours as needed for wheezing or shortness of breath. 10/09/23   White, Elizabeth A, PA-C  cetirizine  (ZYRTEC  ALLERGY) 10 MG tablet Take 1 tablet (10 mg total) by mouth daily. 10/09/23   White, Elizabeth A, PA-C  doxycycline  (VIBRAMYCIN ) 100 MG capsule Take 1 capsule (100 mg total) by mouth 2 (two) times daily for 7 days. 03/25/24 04/01/24  Alejandria Wessells, Jodi R, NP  oxyCODONE  (ROXICODONE ) 5 MG immediate release tablet Take 1 tablet (5 mg total) by mouth every 8 (eight) hours as needed. 01/09/24 01/08/25  Elisabeth Valli BIRCH, MD    Family History Family History  Problem Relation Age of Onset   High blood pressure Father    Other Father        R hand surgery   Sleep apnea Father    Diabetes Paternal Grandmother    High blood pressure Paternal Grandmother    Other Paternal Grandfather        prediabetes    Social History Social History   Tobacco Use   Smoking status: Never    Passive exposure: Yes   Smokeless tobacco: Never  Vaping Use   Vaping status: Never Used  Substance Use Topics   Alcohol use: Yes    Comment: occ   Drug use:  Yes    Types: Marijuana     Allergies   Patient has no known allergies.   Review of Systems Review of Systems  Genitourinary:  Positive for dysuria.     Physical Exam Triage Vital Signs ED Triage Vitals [03/31/24 1646]  Encounter Vitals Group     BP 111/74     Girls Systolic BP Percentile      Girls Diastolic BP Percentile      Boys Systolic BP Percentile      Boys Diastolic BP Percentile      Pulse Rate 84     Resp 20     Temp 98.7 F (37.1 C)     Temp Source Oral     SpO2 96 %     Weight      Height      Head Circumference      Peak Flow      Pain Score 0     Pain Loc      Pain Education      Exclude from Growth Chart    No data found.  Updated Vital Signs BP 111/74   Pulse 84   Temp 98.7 F (37.1 C) (Oral)   Resp 20    SpO2 96%   Visual Acuity Right Eye Distance:   Left Eye Distance:   Bilateral Distance:    Right Eye Near:   Left Eye Near:    Bilateral Near:     Physical Exam Vitals and nursing note reviewed.  Constitutional:      Appearance: Normal appearance.  HENT:     Head: Normocephalic and atraumatic.  Eyes:     Pupils: Pupils are equal, round, and reactive to light.  Cardiovascular:     Rate and Rhythm: Normal rate.  Pulmonary:     Effort: Pulmonary effort is normal.  Skin:    General: Skin is warm and dry.  Neurological:     General: No focal deficit present.     Mental Status: He is alert and oriented to person, place, and time.  Psychiatric:        Mood and Affect: Mood normal.        Behavior: Behavior normal.      UC Treatments / Results  Labs (all labs ordered are listed, but only abnormal results are displayed) Labs Reviewed  POCT URINE DIPSTICK - Abnormal; Notable for the following components:      Result Value   Ketones, POC UA trace (5) (*)    Spec Grav, UA >=1.030 (*)    POC PROTEIN,UA =100 (*)    Leukocytes, UA Trace (*)    All other components within normal limits  URINE CULTURE  CYTOLOGY, (ORAL, ANAL, URETHRAL) ANCILLARY ONLY    EKG   Radiology No results found.  Procedures Procedures (including critical care time)  Medications Ordered in UC Medications - No data to display  Initial Impression / Assessment and Plan / UC Course  I have reviewed the triage vital signs and the nursing notes.  Pertinent labs & imaging results that were available during my care of the patient were reviewed by me and considered in my medical decision making (see chart for details).     Reviewed exam and symptoms with patient.  UA with trace leuks, similar to previous UA from last week.  Will send for culture given symptoms.  Patient reports dysuria x 2 weeks that resolves when he drinks more water.  Recent STD and UA testing was negative  as well.  Will advise  patient to increase water intake to follow-up with his PCP or urology if his symptoms persist.  ER precautions reviewed Final Clinical Impressions(s) / UC Diagnoses   Final diagnoses:  Dysuria  Screen for STD (sexually transmitted disease)     Discharge Instructions      The clinic will contact you with results of the testing done today if positive.  Increase your fluid intake as this seems to improve your symptoms.  Please follow-up with your PCP at your scheduled appointment for further evaluation and treatment.  Please go to the ER for any worsening symptoms.  Hope you feel better soon!     ED Prescriptions   None    PDMP not reviewed this encounter.   Loreda Myla SAUNDERS, NP 03/31/24 [phone]

## 2024-04-01 LAB — CYTOLOGY, (ORAL, ANAL, URETHRAL) ANCILLARY ONLY
Chlamydia: NEGATIVE
Comment: NEGATIVE
Comment: NEGATIVE
Comment: NORMAL
Neisseria Gonorrhea: NEGATIVE
Trichomonas: NEGATIVE

## 2024-04-02 ENCOUNTER — Ambulatory Visit (HOSPITAL_COMMUNITY): Payer: Self-pay

## 2024-04-02 LAB — URINE CULTURE: Culture: NO GROWTH

## 2024-05-11 NOTE — Progress Notes (Incomplete)
" ° °{  Blank single:19197::New Patient Visit,Established Visit,Acute Visit}  There were no vitals taken for this visit.   Subjective:    Patient ID: Bruce Hickman, male    DOB: Jan 10, 2001, 24 y.o.   MRN: [id number]  CC: No chief complaint on file.   HPI: Bruce Hickman is a 24 y.o. male presents for new patient visit to establish care. Introduced to publishing rights manager role and practice setting. All questions answered. Discussed provider/patient relationship and expectations.  Past Medical History:  Diagnosis Date   Medical history non-contributory     Past Surgical History:  Procedure Laterality Date   ORCHIECTOMY Left 01/09/2024   Procedure: ORCHIOPEXY ADULT;  Surgeon: Elisabeth Valli BIRCH, MD;  Location[place] [facility] [place];  Service: Urology;  Laterality: Left;   ORCHIOPEXY Right 01/09/2024   Procedure: ORCHIOPEXY ADULT;  Surgeon: Elisabeth Valli BIRCH, MD;  Location: [facility] OR;  Service: Urology;  Laterality: Right;   SCROTAL EXPLORATION N/A 01/09/2024   Procedure: EXPLORATION, SCROTUM;  Surgeon: Elisabeth Valli BIRCH, MD;  Location: [facility] OR;  Service: Urology;  Laterality: N/A;    Family History  Problem Relation Age of Onset   High blood pressure Father    Other Father        R hand surgery   Sleep apnea Father    Diabetes Paternal Grandmother    High blood pressure Paternal Grandmother    Other Paternal Grandfather        prediabetes     Social History[1]  Medications Ordered Prior to Encounter[2]   Review of Systems      Objective:    There were no vitals taken for this visit.  Wt Readings from Last 3 Encounters:  01/09/24 215 lb (97.5 kg)  06/28/23 212 lb (96.2 kg)  07/31/20 276 lb (125.2 kg) (>99%, Z= 2.78)*   * Growth percentiles are based on CDC (Boys, 2-20 Years) data.    BP Readings from Last 3 Encounters:  03/31/24 111/74  03/25/24 106/85  01/09/24 113/67    Physical Exam     Assessment & Plan:   Problem List Items Addressed This Visit   None    Follow up  plan: No follow-ups on file.  Damien Blanks     [1]  Social History Tobacco Use   Smoking status: Never    Passive exposure: Yes   Smokeless tobacco: Never  Vaping Use   Vaping status: Never Used  Substance Use Topics   Alcohol use: Yes    Comment: occ   Drug use: Yes    Types: Marijuana  [2]  Current Outpatient Medications on File Prior to Visit  Medication Sig Dispense Refill   acetaminophen  (TYLENOL ) 500 MG tablet Take 2 tablets (1,000 mg total) by mouth every 6 (six) hours as needed. 100 tablet 2   albuterol  (VENTOLIN  HFA) 108 (90 Base) MCG/ACT inhaler Inhale 1-2 puffs into the lungs every 6 (six) hours as needed for wheezing or shortness of breath. 6.7 g 3   cetirizine  (ZYRTEC  ALLERGY) 10 MG tablet Take 1 tablet (10 mg total) by mouth daily. 30 tablet 1   oxyCODONE  (ROXICODONE ) 5 MG immediate release tablet Take 1 tablet (5 mg total) by mouth every 8 (eight) hours as needed. 20 tablet 0   No current facility-administered medications on file prior to visit.   "

## 2024-05-12 ENCOUNTER — Ambulatory Visit: Payer: Self-pay | Admitting: Nurse Practitioner
